# Patient Record
Sex: Female | Born: 1975 | Race: Black or African American | Hispanic: No | Smoking: Never smoker
Health system: Southern US, Community
[De-identification: ages and names within clinical notes are randomized; demographics above are authoritative.]

## PROBLEM LIST (undated history)

## (undated) DIAGNOSIS — E785 Hyperlipidemia, unspecified: Secondary | ICD-10-CM

## (undated) DIAGNOSIS — E7800 Pure hypercholesterolemia, unspecified: Secondary | ICD-10-CM

## (undated) DIAGNOSIS — D709 Neutropenia, unspecified: Secondary | ICD-10-CM

## (undated) DIAGNOSIS — I6529 Occlusion and stenosis of unspecified carotid artery: Secondary | ICD-10-CM

## (undated) HISTORY — DX: Neutropenia, unspecified: D70.9

## (undated) HISTORY — DX: Hyperlipidemia, unspecified: E78.5

## (undated) HISTORY — DX: Pure hypercholesterolemia, unspecified: E78.00

## (undated) HISTORY — DX: Occlusion and stenosis of unspecified carotid artery: I65.29

---

## 2001-10-25 ENCOUNTER — Other Ambulatory Visit: Admission: RE | Admit: 2001-10-25 | Discharge: 2001-10-25 | Payer: Self-pay | Admitting: Gynecology

## 2002-09-20 ENCOUNTER — Other Ambulatory Visit: Admission: RE | Admit: 2002-09-20 | Discharge: 2002-09-20 | Payer: Self-pay | Admitting: Gynecology

## 2003-10-22 ENCOUNTER — Other Ambulatory Visit: Admission: RE | Admit: 2003-10-22 | Discharge: 2003-10-22 | Payer: Self-pay | Admitting: Gynecology

## 2004-08-08 HISTORY — PX: GYNECOLOGIC CRYOSURGERY: SHX857

## 2004-10-22 ENCOUNTER — Other Ambulatory Visit: Admission: RE | Admit: 2004-10-22 | Discharge: 2004-10-22 | Payer: Self-pay | Admitting: Gynecology

## 2005-01-07 ENCOUNTER — Ambulatory Visit (HOSPITAL_BASED_OUTPATIENT_CLINIC_OR_DEPARTMENT_OTHER): Admission: RE | Admit: 2005-01-07 | Discharge: 2005-01-07 | Payer: Self-pay | Admitting: Gynecology

## 2005-05-06 ENCOUNTER — Other Ambulatory Visit: Admission: RE | Admit: 2005-05-06 | Discharge: 2005-05-06 | Payer: Self-pay | Admitting: Gynecology

## 2005-10-24 ENCOUNTER — Other Ambulatory Visit: Admission: RE | Admit: 2005-10-24 | Discharge: 2005-10-24 | Payer: Self-pay | Admitting: Gynecology

## 2006-11-01 ENCOUNTER — Other Ambulatory Visit: Admission: RE | Admit: 2006-11-01 | Discharge: 2006-11-01 | Payer: Self-pay | Admitting: Gynecology

## 2008-08-08 HISTORY — PX: KIDNEY DONATION: SHX685

## 2014-02-26 ENCOUNTER — Encounter (HOSPITAL_COMMUNITY): Payer: Self-pay | Admitting: Emergency Medicine

## 2014-02-26 ENCOUNTER — Emergency Department (HOSPITAL_COMMUNITY): Payer: Managed Care, Other (non HMO)

## 2014-02-26 ENCOUNTER — Emergency Department (HOSPITAL_COMMUNITY)
Admission: EM | Admit: 2014-02-26 | Discharge: 2014-02-27 | Disposition: A | Discharge status: Home / Self Care | Payer: Managed Care, Other (non HMO) | Attending: Emergency Medicine | Admitting: Emergency Medicine

## 2014-02-26 DIAGNOSIS — Z3202 Encounter for pregnancy test, result negative: Secondary | ICD-10-CM | POA: Insufficient documentation

## 2014-02-26 DIAGNOSIS — R509 Fever, unspecified: Secondary | ICD-10-CM | POA: Insufficient documentation

## 2014-02-26 DIAGNOSIS — R319 Hematuria, unspecified: Secondary | ICD-10-CM | POA: Insufficient documentation

## 2014-02-26 DIAGNOSIS — Z79899 Other long term (current) drug therapy: Secondary | ICD-10-CM | POA: Insufficient documentation

## 2014-02-26 DIAGNOSIS — R10819 Abdominal tenderness, unspecified site: Secondary | ICD-10-CM | POA: Insufficient documentation

## 2014-02-26 LAB — COMPREHENSIVE METABOLIC PANEL
ALK PHOS: 68 U/L (ref 39–117)
ALT: 16 U/L (ref 0–35)
AST: 20 U/L (ref 0–37)
Albumin: 3.8 g/dL (ref 3.5–5.2)
Anion gap: 14 (ref 5–15)
BILIRUBIN TOTAL: 0.7 mg/dL (ref 0.3–1.2)
BUN: 8 mg/dL (ref 6–23)
CO2: 23 mEq/L (ref 19–32)
Calcium: 9.9 mg/dL (ref 8.4–10.5)
Chloride: 99 mEq/L (ref 96–112)
Creatinine, Ser: 1.13 mg/dL — ABNORMAL HIGH (ref 0.50–1.10)
GFR, EST AFRICAN AMERICAN: 69 mL/min — AB (ref >90)
GFR, EST NON AFRICAN AMERICAN: 60 mL/min — AB (ref >90)
GLUCOSE: 91 mg/dL (ref 70–99)
POTASSIUM: 4.1 meq/L (ref 3.7–5.3)
Sodium: 136 mEq/L — ABNORMAL LOW (ref 137–147)
Total Protein: 8.4 g/dL — ABNORMAL HIGH (ref 6.0–8.3)

## 2014-02-26 LAB — CBC WITH DIFFERENTIAL/PLATELET
Basophils Absolute: 0 10*3/uL (ref 0.0–0.1)
Basophils Relative: 0 % (ref 0–1)
Eosinophils Absolute: 0 10*3/uL (ref 0.0–0.7)
Eosinophils Relative: 1 % (ref 0–5)
HCT: 42.4 % (ref 36.0–46.0)
HEMOGLOBIN: 13.8 g/dL (ref 12.0–15.0)
LYMPHS ABS: 0.7 10*3/uL (ref 0.7–4.0)
Lymphocytes Relative: 10 % — ABNORMAL LOW (ref 12–46)
MCH: 30.1 pg (ref 26.0–34.0)
MCHC: 32.5 g/dL (ref 30.0–36.0)
MCV: 92.4 fL (ref 78.0–100.0)
MONOS PCT: 11 % (ref 3–12)
Monocytes Absolute: 0.8 10*3/uL (ref 0.1–1.0)
NEUTROS PCT: 78 % — AB (ref 43–77)
Neutro Abs: 5.7 10*3/uL (ref 1.7–7.7)
Platelets: 165 10*3/uL (ref 150–400)
RBC: 4.59 MIL/uL (ref 3.87–5.11)
RDW: 12.1 % (ref 11.5–15.5)
WBC: 7.2 10*3/uL (ref 4.0–10.5)

## 2014-02-26 LAB — WET PREP, GENITAL
Clue Cells Wet Prep HPF POC: NONE SEEN
Trich, Wet Prep: NONE SEEN
WBC, Wet Prep HPF POC: NONE SEEN
Yeast Wet Prep HPF POC: NONE SEEN

## 2014-02-26 LAB — LIPASE, BLOOD: Lipase: 27 U/L (ref 11–59)

## 2014-02-26 LAB — I-STAT CG4 LACTIC ACID, ED: Lactic Acid, Venous: 0.72 mmol/L (ref 0.5–2.2)

## 2014-02-26 LAB — URINALYSIS, ROUTINE W REFLEX MICROSCOPIC
BILIRUBIN URINE: NEGATIVE
Glucose, UA: NEGATIVE mg/dL
KETONES UR: 15 mg/dL — AB
Leukocytes, UA: NEGATIVE
Nitrite: NEGATIVE
Protein, ur: 30 mg/dL — AB
Specific Gravity, Urine: 1.014 (ref 1.005–1.030)
UROBILINOGEN UA: 1 mg/dL (ref 0.0–1.0)
pH: 7.5 (ref 5.0–8.0)

## 2014-02-26 LAB — POC URINE PREG, ED: Preg Test, Ur: NEGATIVE

## 2014-02-26 LAB — URINE MICROSCOPIC-ADD ON

## 2014-02-26 MED ORDER — ACETAMINOPHEN 325 MG PO TABS
975.0000 mg | ORAL_TABLET | Freq: Once | ORAL | Status: AC
  Administered 2014-02-26: 975 mg via ORAL
  Filled 2014-02-26: qty 3

## 2014-02-26 MED ORDER — IOHEXOL 300 MG/ML  SOLN
100.0000 mL | Freq: Once | INTRAMUSCULAR | Status: AC | PRN
  Administered 2014-02-26: 80 mL via INTRAVENOUS

## 2014-02-26 MED ORDER — IOHEXOL 300 MG/ML  SOLN
25.0000 mL | Freq: Once | INTRAMUSCULAR | Status: AC | PRN
  Administered 2014-02-26: 25 mL via ORAL

## 2014-02-26 NOTE — ED Provider Notes (Signed)
CSN: [id number]     Arrival date & time 02/26/14  1843 History   First MD Initiated Contact with Patient 02/26/14 2016     Chief Complaint  Patient presents with  . Abdominal Pain    The patient said she started having abdominal pain Monday and so she stayed in bed. She denies any other symptoms.  The patient said now she is having chills and her pain has gotten worse.  . Fever     (Consider location/radiation/quality/duration/timing/severity/associated sxs/prior Treatment) HPI Complains of abdominal pain onset 2 days ago. The pain is at epigastrium and lower abdomen. Pain at epigastrium is constant. Pain in lower abdomen only occurs when she touches the area. She denies urinary frequency no vaginal discharge. Last bowel movement was 4 days ago, normal. No nausea or vomiting. No other associated symptoms. Nothing makes pain better or worse. Associated symptoms include chills. No known fever. No urinary symptoms. She was advised by nursing care center since she has blood in her urine History reviewed. No pertinent past medical history. Past Surgical History  Procedure Laterality Date  . Kidney donation  2010   History reviewed. No pertinent family history. History  Substance Use Topics  . Smoking status: Never Smoker   . Smokeless tobacco: Never Used  . Alcohol Use: Yes     Comment: Occ   OB History   Grav Para Term Preterm Abortions TAB SAB Ect Mult Living                 Review of Systems  Constitutional: Positive for chills.  HENT: Negative.   Respiratory: Negative.   Cardiovascular: Negative.   Gastrointestinal: Positive for abdominal pain.  Genitourinary: Positive for hematuria.       Last normal menstrual period 02/08/2014  Musculoskeletal: Negative.   Skin: Negative.   Neurological: Negative.   Psychiatric/Behavioral: Negative.       Allergies  Review of patient's allergies indicates no known allergies.  Home Medications   Prior to Admission medications    Medication Sig Start Date End Date Taking? Authorizing Provider  Multiple Vitamin (MULTIVITAMIN WITH MINERALS) TABS tablet Take 1 tablet by mouth daily.   Yes Historical Provider, MD   BP 139/65  Pulse 119  Temp(Src) 101.5 F (38.6 C) (Oral)  Resp 20  SpO2 94%  LMP 02/12/2014 Physical Exam  Nursing note and vitals reviewed. Constitutional: She appears well-developed and well-nourished.  HENT:  Head: Normocephalic and atraumatic.  Eyes: Conjunctivae are normal. Pupils are equal, round, and reactive to light.  Neck: Neck supple. No tracheal deviation present. No thyromegaly present.  Cardiovascular:  No murmur heard. Moderately tachycardic  Pulmonary/Chest: Effort normal and breath sounds normal.  Abdominal: Soft. Bowel sounds are normal. She exhibits mass. She exhibits no distension. There is tenderness. There is no rebound and no guarding.  Diffuse tenderness  Genitourinary:  No external lesions.-whitevaginal discharge. Cervical os closed no cervical motion tenderness no adnexal masses or tenderness  Musculoskeletal: Normal range of motion. She exhibits no edema and no tenderness.  Neurological: She is alert. Coordination normal.  Skin: Skin is warm and dry. No rash noted.  Psychiatric: She has a normal mood and affect.   Declines pain medicine presently    11: 45 p.m. patient resting comfortably ED Course  Procedures (including critical care time) Labs Review Labs Reviewed  CBC WITH DIFFERENTIAL - Abnormal; Notable for the following:    Neutrophils Relative % 78 (*)    Lymphocytes Relative 10 (*)  All other components within normal limits  URINALYSIS, ROUTINE W REFLEX MICROSCOPIC - Abnormal; Notable for the following:    Hgb urine dipstick MODERATE (*)    Ketones, ur 15 (*)    Protein, ur 30 (*)    All other components within normal limits  URINE MICROSCOPIC-ADD ON  COMPREHENSIVE METABOLIC PANEL  LIPASE, BLOOD  POC URINE PREG, ED  I-STAT CG4 LACTIC ACID, ED     Imaging Review No results found.   EKG Interpretation None     Results for orders placed during the hospital encounter of 02/26/14  WET PREP, GENITAL      Result Value Ref Range   Yeast Wet Prep HPF POC NONE SEEN  NONE SEEN   Trich, Wet Prep NONE SEEN  NONE SEEN   Clue Cells Wet Prep HPF POC NONE SEEN  NONE SEEN   WBC, Wet Prep HPF POC NONE SEEN  NONE SEEN  CBC WITH DIFFERENTIAL      Result Value Ref Range   WBC 7.2  4.0 - 10.5 K/uL   RBC 4.59  3.87 - 5.11 MIL/uL   Hemoglobin 13.8  12.0 - 15.0 g/dL   HCT 40.9  81.1 - 91.4 %   MCV 92.4  78.0 - 100.0 fL   MCH 30.1  26.0 - 34.0 pg   MCHC 32.5  30.0 - 36.0 g/dL   RDW 78.2  95.6 - 21.3 %   Platelets 165  150 - 400 K/uL   Neutrophils Relative % 78 (*) 43 - 77 %   Neutro Abs 5.7  1.7 - 7.7 K/uL   Lymphocytes Relative 10 (*) 12 - 46 %   Lymphs Abs 0.7  0.7 - 4.0 K/uL   Monocytes Relative 11  3 - 12 %   Monocytes Absolute 0.8  0.1 - 1.0 K/uL   Eosinophils Relative 1  0 - 5 %   Eosinophils Absolute 0.0  0.0 - 0.7 K/uL   Basophils Relative 0  0 - 1 %   Basophils Absolute 0.0  0.0 - 0.1 K/uL  COMPREHENSIVE METABOLIC PANEL      Result Value Ref Range   Sodium 136 (*) 137 - 147 mEq/L   Potassium 4.1  3.7 - 5.3 mEq/L   Chloride 99  96 - 112 mEq/L   CO2 23  19 - 32 mEq/L   Glucose, Bld 91  70 - 99 mg/dL   BUN 8  6 - 23 mg/dL   Creatinine, Ser 0.86 (*) 0.50 - 1.10 mg/dL   Calcium 9.9  8.4 - 57.8 mg/dL   Total Protein 8.4 (*) 6.0 - 8.3 g/dL   Albumin 3.8  3.5 - 5.2 g/dL   AST 20  0 - 37 U/L   ALT 16  0 - 35 U/L   Alkaline Phosphatase 68  39 - 117 U/L   Total Bilirubin 0.7  0.3 - 1.2 mg/dL   GFR calc non Af Amer 60 (*) >90 mL/min   GFR calc Af Amer 69 (*) >90 mL/min   Anion gap 14  5 - 15  LIPASE, BLOOD      Result Value Ref Range   Lipase 27  11 - 59 U/L  URINALYSIS, ROUTINE W REFLEX MICROSCOPIC      Result Value Ref Range   Color, Urine YELLOW  YELLOW   APPearance CLEAR  CLEAR   Specific Gravity, Urine 1.014  1.005 -  1.030   pH 7.5  5.0 - 8.0   Glucose, UA  NEGATIVE  NEGATIVE mg/dL   Hgb urine dipstick MODERATE (*) NEGATIVE   Bilirubin Urine NEGATIVE  NEGATIVE   Ketones, ur 15 (*) NEGATIVE mg/dL   Protein, ur 30 (*) NEGATIVE mg/dL   Urobilinogen, UA 1.0  0.0 - 1.0 mg/dL   Nitrite NEGATIVE  NEGATIVE   Leukocytes, UA NEGATIVE  NEGATIVE  URINE MICROSCOPIC-ADD ON      Result Value Ref Range   Squamous Epithelial / LPF RARE  RARE   WBC, UA 0-2  <3 WBC/hpf   RBC / HPF 7-10  <3 RBC/hpf   Bacteria, UA RARE  RARE  POC URINE PREG, ED      Result Value Ref Range   Preg Test, Ur NEGATIVE  NEGATIVE  I-STAT CG4 LACTIC ACID, ED      Result Value Ref Range   Lactic Acid, Venous 0.72  0.5 - 2.2 mmol/L   Ct Abdomen Pelvis W Contrast  02/26/2014   CLINICAL DATA:  Lower abdominal pain for 3 days.  Chills.  EXAM: CT ABDOMEN AND PELVIS WITH CONTRAST  TECHNIQUE: Multidetector CT imaging of the abdomen and pelvis was performed using the standard protocol following bolus administration of intravenous contrast.  CONTRAST:  80 mL OMNIPAQUE IOHEXOL 300 MG/ML  SOLN  COMPARISON:  None.  FINDINGS: The lung bases demonstrate minimal atelectasis but are otherwise clear. No pleural or pericardial effusion.  The liver, gallbladder, spleen, adrenal glands, pancreas and biliary tree are unremarkable. The patient is status post left nephrectomy. Compensatory hypertrophy of the right kidney is noted. The stomach, small and large bowel and appendix appear normal. The left ovary appears somewhat prominent measuring 5.5 x 2.8 x 3.7 cm. There is an incomplete hyper attenuating rim like structure likely represent an involuting cyst in the left ovary. There is a small volume of free pelvic fluid. Uterus and right adnexa are unremarkable. No lymphadenopathy is seen. No focal bony abnormality is identified.  IMPRESSION: Findings most compatible with an involuting left ovarian cyst.  Status post left nephrectomy.   Electronically Signed   By: Drusilla Kanner M.D.   On: 02/26/2014 21:37    MDM  Plan trylenol for pain or fever, urine recheck by pmd Final diagnoses:  None  Dx #1 febrile ilness #2 abdominal pain #3 microscopic hematuria       Doug Sou, MD 02/26/14 2352

## 2014-02-26 NOTE — ED Notes (Signed)
The patient said she started having abdominal pain Monday and so she stayed in bed. She denies any other symptoms.  The patient said now she is having chills and her pain has gotten worse.  She went to Urgent Care and they sent her here for a CT scan to rule out pancreatitis and appendicitis.  She was advised she does have blood in her urine.  She did advised me that she is a kidney donor.  She donated a kidney in 2010 to her husband.

## 2014-02-26 NOTE — Discharge Instructions (Signed)
Take Tylenol as directed every 4 hours while awake for pain or for temperature higher than 100.4. You have trace microscopic amounts of blood in your urine. Get your urine rechecked by your primary care physician in a week. If you continue to have blood in your urine, you should ask your primary care physician for a referral to a urologist. Return if your condition worsens for any reason .tonight's CT scan showed a cyst on your left ovary , which is not likely the cause of your pain

## 2014-02-26 NOTE — ED Notes (Signed)
C/o upper abdominal pain x 3-4 days.  Noticed pain in RLQ today when she coughed, pain with palpation.  Went to urgent care, sent here for further evaluation.

## 2014-02-27 LAB — HIV ANTIBODY (ROUTINE TESTING W REFLEX): HIV 1&2 Ab, 4th Generation: NONREACTIVE

## 2014-02-27 LAB — RPR

## 2014-02-28 LAB — GC/CHLAMYDIA PROBE AMP
CT Probe RNA: NEGATIVE
GC PROBE AMP APTIMA: NEGATIVE

## 2014-03-04 ENCOUNTER — Ambulatory Visit
Admission: RE | Admit: 2014-03-04 | Discharge: 2014-03-04 | Disposition: A | Discharge status: Home / Self Care | Payer: Managed Care, Other (non HMO) | Source: Ambulatory Visit | Attending: Nurse Practitioner | Admitting: Nurse Practitioner

## 2014-03-04 ENCOUNTER — Other Ambulatory Visit: Payer: Self-pay | Admitting: Nurse Practitioner

## 2014-03-04 DIAGNOSIS — R05 Cough: Secondary | ICD-10-CM

## 2014-03-04 DIAGNOSIS — R059 Cough, unspecified: Secondary | ICD-10-CM

## 2014-03-04 DIAGNOSIS — R319 Hematuria, unspecified: Secondary | ICD-10-CM

## 2016-03-16 ENCOUNTER — Other Ambulatory Visit: Payer: Self-pay | Admitting: Obstetrics and Gynecology

## 2016-03-16 DIAGNOSIS — N6019 Diffuse cystic mastopathy of unspecified breast: Secondary | ICD-10-CM

## 2016-03-16 DIAGNOSIS — R928 Other abnormal and inconclusive findings on diagnostic imaging of breast: Secondary | ICD-10-CM

## 2016-03-23 ENCOUNTER — Ambulatory Visit
Admission: RE | Admit: 2016-03-23 | Discharge: 2016-03-23 | Disposition: A | Discharge status: Home / Self Care | Payer: Managed Care, Other (non HMO) | Source: Ambulatory Visit | Attending: Obstetrics and Gynecology | Admitting: Obstetrics and Gynecology

## 2016-03-23 DIAGNOSIS — N6019 Diffuse cystic mastopathy of unspecified breast: Secondary | ICD-10-CM

## 2016-03-23 DIAGNOSIS — R928 Other abnormal and inconclusive findings on diagnostic imaging of breast: Secondary | ICD-10-CM

## 2017-09-22 DIAGNOSIS — Z124 Encounter for screening for malignant neoplasm of cervix: Secondary | ICD-10-CM | POA: Diagnosis not present

## 2017-09-22 DIAGNOSIS — Z01419 Encounter for gynecological examination (general) (routine) without abnormal findings: Secondary | ICD-10-CM | POA: Diagnosis not present

## 2017-10-04 DIAGNOSIS — Z1231 Encounter for screening mammogram for malignant neoplasm of breast: Secondary | ICD-10-CM | POA: Diagnosis not present

## 2017-10-10 DIAGNOSIS — Z3202 Encounter for pregnancy test, result negative: Secondary | ICD-10-CM | POA: Diagnosis not present

## 2017-10-10 DIAGNOSIS — N898 Other specified noninflammatory disorders of vagina: Secondary | ICD-10-CM | POA: Diagnosis not present

## 2017-11-23 DIAGNOSIS — R300 Dysuria: Secondary | ICD-10-CM | POA: Diagnosis not present

## 2018-02-16 DIAGNOSIS — E785 Hyperlipidemia, unspecified: Secondary | ICD-10-CM | POA: Diagnosis not present

## 2018-02-16 DIAGNOSIS — Z0000 Encounter for general adult medical examination without abnormal findings: Secondary | ICD-10-CM | POA: Diagnosis not present

## 2018-02-16 DIAGNOSIS — Z23 Encounter for immunization: Secondary | ICD-10-CM | POA: Diagnosis not present

## 2018-06-01 DIAGNOSIS — E785 Hyperlipidemia, unspecified: Secondary | ICD-10-CM | POA: Diagnosis not present

## 2018-06-01 DIAGNOSIS — M545 Low back pain: Secondary | ICD-10-CM | POA: Diagnosis not present

## 2018-06-01 DIAGNOSIS — M62838 Other muscle spasm: Secondary | ICD-10-CM | POA: Diagnosis not present

## 2018-07-20 ENCOUNTER — Ambulatory Visit
Admission: RE | Admit: 2018-07-20 | Discharge: 2018-07-20 | Disposition: A | Discharge status: Home / Self Care | Payer: 59 | Source: Ambulatory Visit | Attending: Internal Medicine | Admitting: Internal Medicine

## 2018-07-20 ENCOUNTER — Other Ambulatory Visit: Payer: Self-pay | Admitting: Internal Medicine

## 2018-07-20 DIAGNOSIS — S46812A Strain of other muscles, fascia and tendons at shoulder and upper arm level, left arm, initial encounter: Secondary | ICD-10-CM

## 2018-07-20 DIAGNOSIS — M542 Cervicalgia: Secondary | ICD-10-CM | POA: Diagnosis not present

## 2018-07-20 DIAGNOSIS — M25512 Pain in left shoulder: Secondary | ICD-10-CM | POA: Diagnosis not present

## 2018-10-24 DIAGNOSIS — M545 Low back pain: Secondary | ICD-10-CM | POA: Diagnosis not present

## 2018-10-24 DIAGNOSIS — M542 Cervicalgia: Secondary | ICD-10-CM | POA: Diagnosis not present

## 2018-10-24 DIAGNOSIS — M25512 Pain in left shoulder: Secondary | ICD-10-CM | POA: Diagnosis not present

## 2018-10-29 DIAGNOSIS — M545 Low back pain: Secondary | ICD-10-CM | POA: Diagnosis not present

## 2018-10-29 DIAGNOSIS — M542 Cervicalgia: Secondary | ICD-10-CM | POA: Diagnosis not present

## 2018-10-29 DIAGNOSIS — M25512 Pain in left shoulder: Secondary | ICD-10-CM | POA: Diagnosis not present

## 2018-11-05 DIAGNOSIS — M545 Low back pain: Secondary | ICD-10-CM | POA: Diagnosis not present

## 2018-11-05 DIAGNOSIS — M25512 Pain in left shoulder: Secondary | ICD-10-CM | POA: Diagnosis not present

## 2018-11-05 DIAGNOSIS — M542 Cervicalgia: Secondary | ICD-10-CM | POA: Diagnosis not present

## 2018-11-08 DIAGNOSIS — M542 Cervicalgia: Secondary | ICD-10-CM | POA: Diagnosis not present

## 2018-11-08 DIAGNOSIS — M545 Low back pain: Secondary | ICD-10-CM | POA: Diagnosis not present

## 2018-11-08 DIAGNOSIS — M25512 Pain in left shoulder: Secondary | ICD-10-CM | POA: Diagnosis not present

## 2018-11-19 DIAGNOSIS — M542 Cervicalgia: Secondary | ICD-10-CM | POA: Diagnosis not present

## 2018-11-19 DIAGNOSIS — M545 Low back pain: Secondary | ICD-10-CM | POA: Diagnosis not present

## 2018-11-19 DIAGNOSIS — M25512 Pain in left shoulder: Secondary | ICD-10-CM | POA: Diagnosis not present

## 2018-11-21 DIAGNOSIS — M542 Cervicalgia: Secondary | ICD-10-CM | POA: Diagnosis not present

## 2018-11-21 DIAGNOSIS — M545 Low back pain: Secondary | ICD-10-CM | POA: Diagnosis not present

## 2018-11-21 DIAGNOSIS — M25512 Pain in left shoulder: Secondary | ICD-10-CM | POA: Diagnosis not present

## 2019-01-03 DIAGNOSIS — Z01419 Encounter for gynecological examination (general) (routine) without abnormal findings: Secondary | ICD-10-CM | POA: Diagnosis not present

## 2019-01-03 DIAGNOSIS — Z1231 Encounter for screening mammogram for malignant neoplasm of breast: Secondary | ICD-10-CM | POA: Diagnosis not present

## 2019-01-04 ENCOUNTER — Other Ambulatory Visit: Payer: Self-pay | Admitting: Obstetrics and Gynecology

## 2019-01-04 DIAGNOSIS — R928 Other abnormal and inconclusive findings on diagnostic imaging of breast: Secondary | ICD-10-CM

## 2019-01-09 ENCOUNTER — Ambulatory Visit
Admission: RE | Admit: 2019-01-09 | Discharge: 2019-01-09 | Disposition: A | Discharge status: Home / Self Care | Payer: 59 | Source: Ambulatory Visit | Attending: Obstetrics and Gynecology | Admitting: Obstetrics and Gynecology

## 2019-01-09 ENCOUNTER — Other Ambulatory Visit: Payer: Self-pay

## 2019-01-09 ENCOUNTER — Other Ambulatory Visit: Payer: Self-pay | Admitting: Obstetrics and Gynecology

## 2019-01-09 DIAGNOSIS — R928 Other abnormal and inconclusive findings on diagnostic imaging of breast: Secondary | ICD-10-CM

## 2019-01-09 DIAGNOSIS — N6489 Other specified disorders of breast: Secondary | ICD-10-CM

## 2019-07-17 ENCOUNTER — Ambulatory Visit
Admission: RE | Admit: 2019-07-17 | Discharge: 2019-07-17 | Disposition: A | Discharge status: Home / Self Care | Payer: 59 | Source: Ambulatory Visit | Attending: Obstetrics and Gynecology | Admitting: Obstetrics and Gynecology

## 2019-07-17 ENCOUNTER — Ambulatory Visit: Payer: 59

## 2019-07-17 ENCOUNTER — Other Ambulatory Visit: Payer: Self-pay

## 2019-07-17 DIAGNOSIS — N6489 Other specified disorders of breast: Secondary | ICD-10-CM

## 2020-02-05 ENCOUNTER — Other Ambulatory Visit: Payer: Self-pay | Admitting: Obstetrics and Gynecology

## 2020-02-05 DIAGNOSIS — R928 Other abnormal and inconclusive findings on diagnostic imaging of breast: Secondary | ICD-10-CM

## 2020-02-19 ENCOUNTER — Ambulatory Visit
Admission: RE | Admit: 2020-02-19 | Discharge: 2020-02-19 | Disposition: A | Discharge status: Home / Self Care | Payer: 59 | Source: Ambulatory Visit | Attending: Obstetrics and Gynecology | Admitting: Obstetrics and Gynecology

## 2020-02-19 ENCOUNTER — Other Ambulatory Visit: Payer: Self-pay

## 2020-02-19 DIAGNOSIS — R928 Other abnormal and inconclusive findings on diagnostic imaging of breast: Secondary | ICD-10-CM

## 2020-03-27 ENCOUNTER — Other Ambulatory Visit: Payer: Self-pay

## 2020-03-27 ENCOUNTER — Other Ambulatory Visit: Payer: 59

## 2020-03-27 DIAGNOSIS — Z20822 Contact with and (suspected) exposure to covid-19: Secondary | ICD-10-CM

## 2020-03-28 LAB — SPECIMEN STATUS REPORT

## 2020-03-28 LAB — NOVEL CORONAVIRUS, NAA: SARS-CoV-2, NAA: NOT DETECTED

## 2020-03-28 LAB — SARS-COV-2, NAA 2 DAY TAT

## 2020-04-03 ENCOUNTER — Other Ambulatory Visit: Payer: Self-pay

## 2020-04-03 ENCOUNTER — Emergency Department (HOSPITAL_COMMUNITY): Payer: 59

## 2020-04-03 ENCOUNTER — Emergency Department (HOSPITAL_COMMUNITY)
Admission: EM | Admit: 2020-04-03 | Discharge: 2020-04-03 | Disposition: A | Discharge status: Home / Self Care | Payer: 59 | Attending: Emergency Medicine | Admitting: Emergency Medicine

## 2020-04-03 DIAGNOSIS — Z5321 Procedure and treatment not carried out due to patient leaving prior to being seen by health care provider: Secondary | ICD-10-CM | POA: Insufficient documentation

## 2020-04-03 DIAGNOSIS — R079 Chest pain, unspecified: Secondary | ICD-10-CM | POA: Insufficient documentation

## 2020-04-03 LAB — BASIC METABOLIC PANEL
Anion gap: 8 (ref 5–15)
BUN: 13 mg/dL (ref 6–20)
CO2: 28 mmol/L (ref 22–32)
Calcium: 9.3 mg/dL (ref 8.9–10.3)
Chloride: 104 mmol/L (ref 98–111)
Creatinine, Ser: 1.07 mg/dL — ABNORMAL HIGH (ref 0.44–1.00)
GFR calc Af Amer: >60 mL/min (ref >60)
GFR calc non Af Amer: >60 mL/min (ref >60)
Glucose, Bld: 99 mg/dL (ref 70–99)
Potassium: 3.8 mmol/L (ref 3.5–5.1)
Sodium: 140 mmol/L (ref 135–145)

## 2020-04-03 LAB — CBC
HCT: 41.3 % (ref 36.0–46.0)
Hemoglobin: 13.5 g/dL (ref 12.0–15.0)
MCH: 30.8 pg (ref 26.0–34.0)
MCHC: 32.7 g/dL (ref 30.0–36.0)
MCV: 94.1 fL (ref 80.0–100.0)
Platelets: 223 10*3/uL (ref 150–400)
RBC: 4.39 MIL/uL (ref 3.87–5.11)
RDW: 11.9 % (ref 11.5–15.5)
WBC: 3.7 10*3/uL — ABNORMAL LOW (ref 4.0–10.5)
nRBC: 0 % (ref 0.0–0.2)

## 2020-04-03 LAB — TROPONIN I (HIGH SENSITIVITY): Troponin I (High Sensitivity): <2 ng/L (ref <18)

## 2020-04-03 LAB — I-STAT BETA HCG BLOOD, ED (NOT ORDERABLE): I-stat hCG, quantitative: <5 m[IU]/mL (ref <5)

## 2020-04-03 NOTE — ED Triage Notes (Signed)
Arrived POV from home. Patient reports  Chest pain that starts in collar bone into chest then around to upper back/neck

## 2021-02-12 ENCOUNTER — Other Ambulatory Visit: Payer: Self-pay | Admitting: Obstetrics and Gynecology

## 2021-02-12 DIAGNOSIS — R928 Other abnormal and inconclusive findings on diagnostic imaging of breast: Secondary | ICD-10-CM

## 2021-03-11 IMAGING — MG MM DIGITAL DIAGNOSTIC UNILAT*L* W/ TOMO W/ CAD
6 series · 6 of 18 positions shown · non-contrast
Comparison: Previous exams.

CLINICAL DATA: Screening recall for possible left breast mass.

EXAM:
DIGITAL DIAGNOSTIC UNILATERAL LEFT MAMMOGRAM WITH TOMO AND CAD;
ULTRASOUND LEFT BREAST LIMITED

[L CC synth-2D]
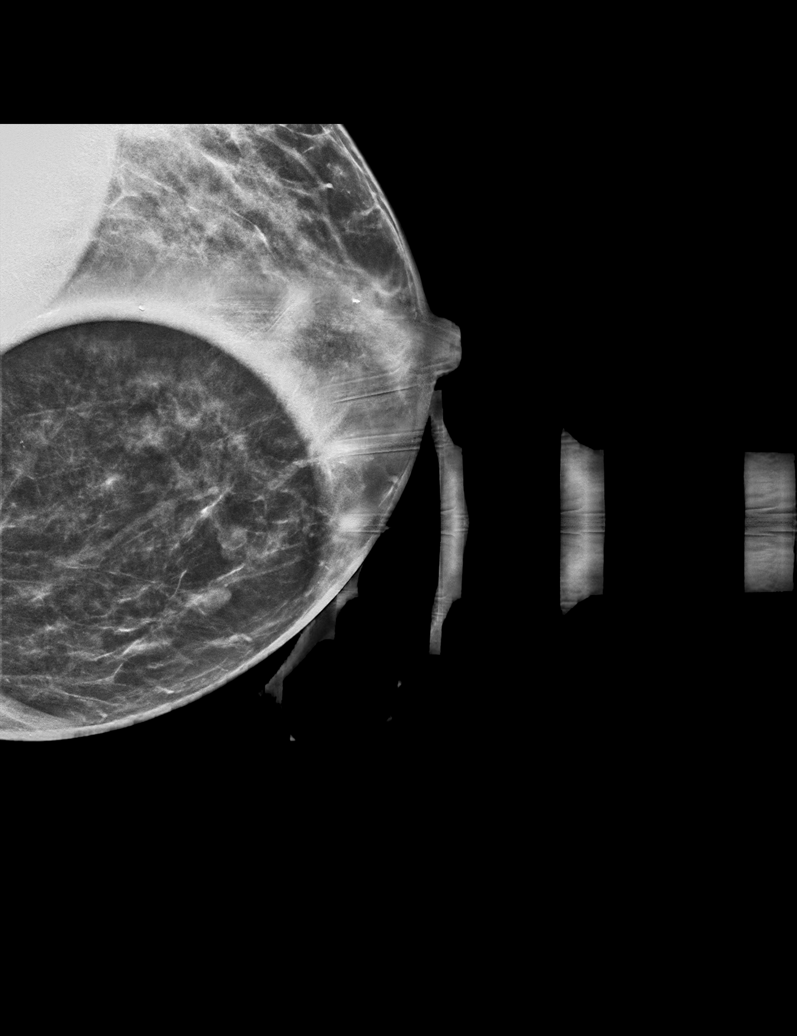

[L MLO synth-2D (1 of 2)]
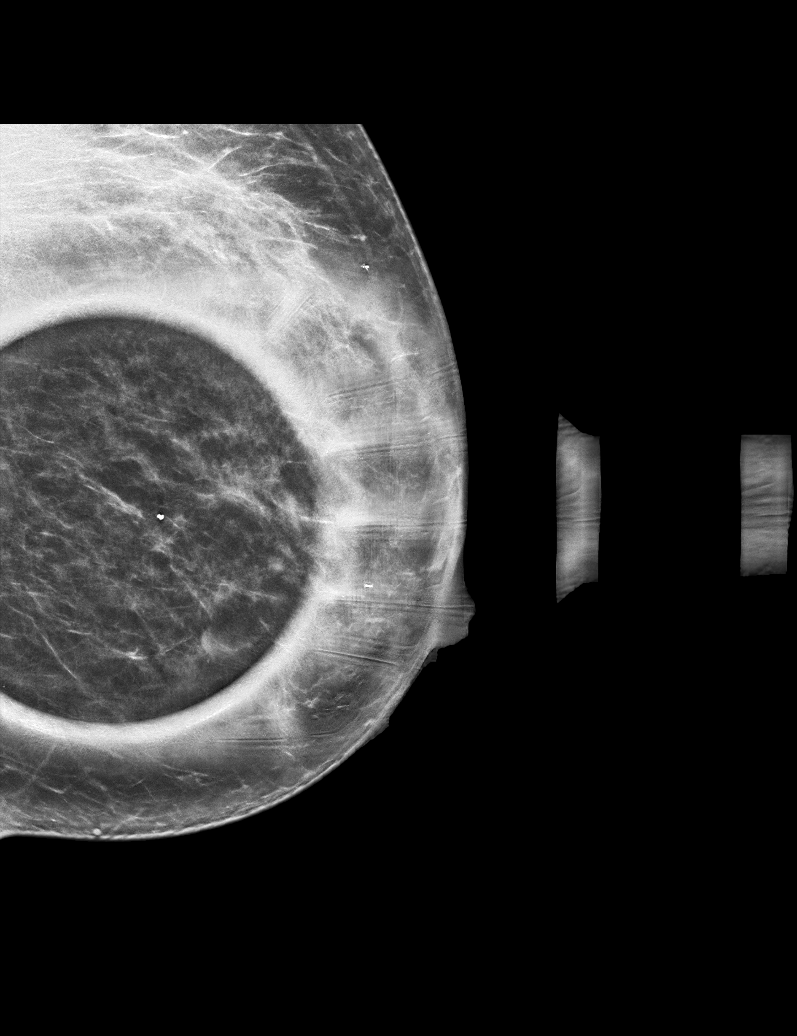

[L MLO synth-2D (2 of 2)]
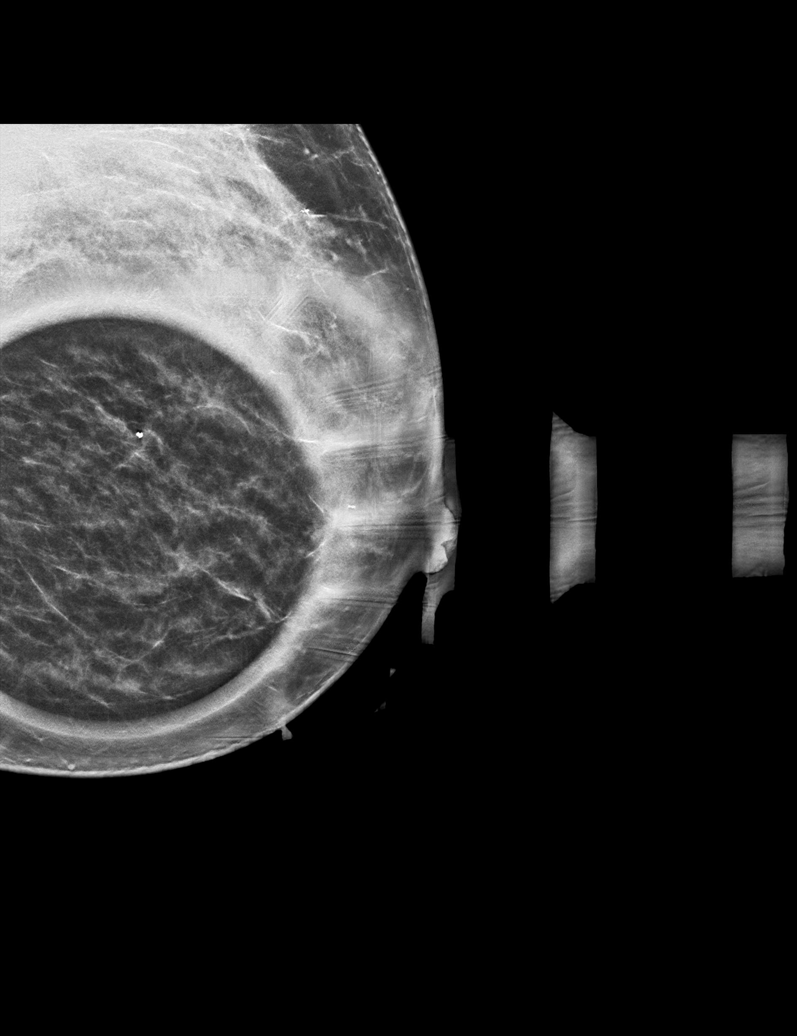

[L MLO tomo (1 of 2) · tomo slice 29/58.0]
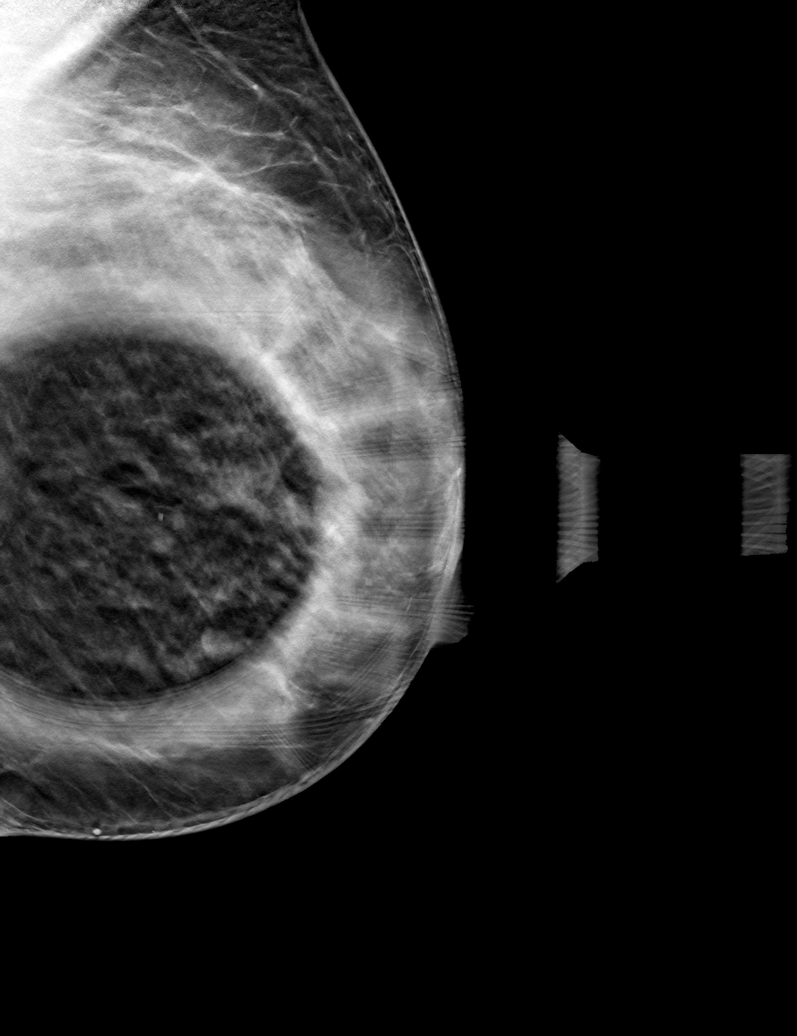

[L CC tomo · tomo slice 29/58.0]
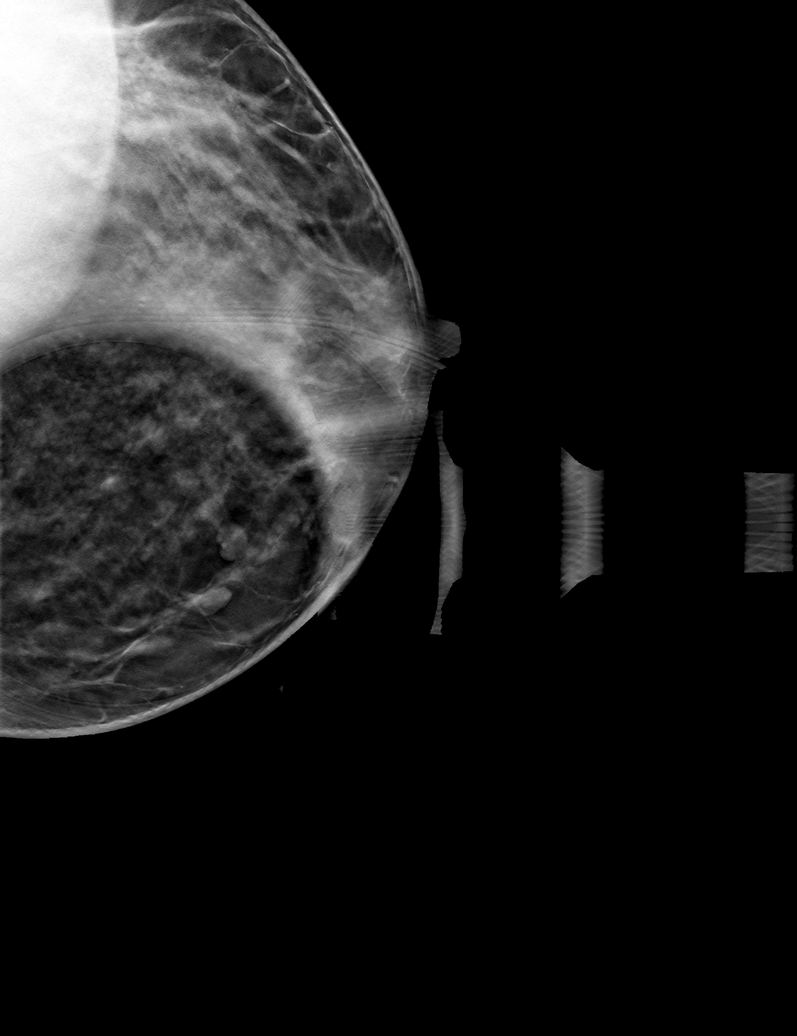

[L MLO tomo (2 of 2) · tomo slice 27/54.0]
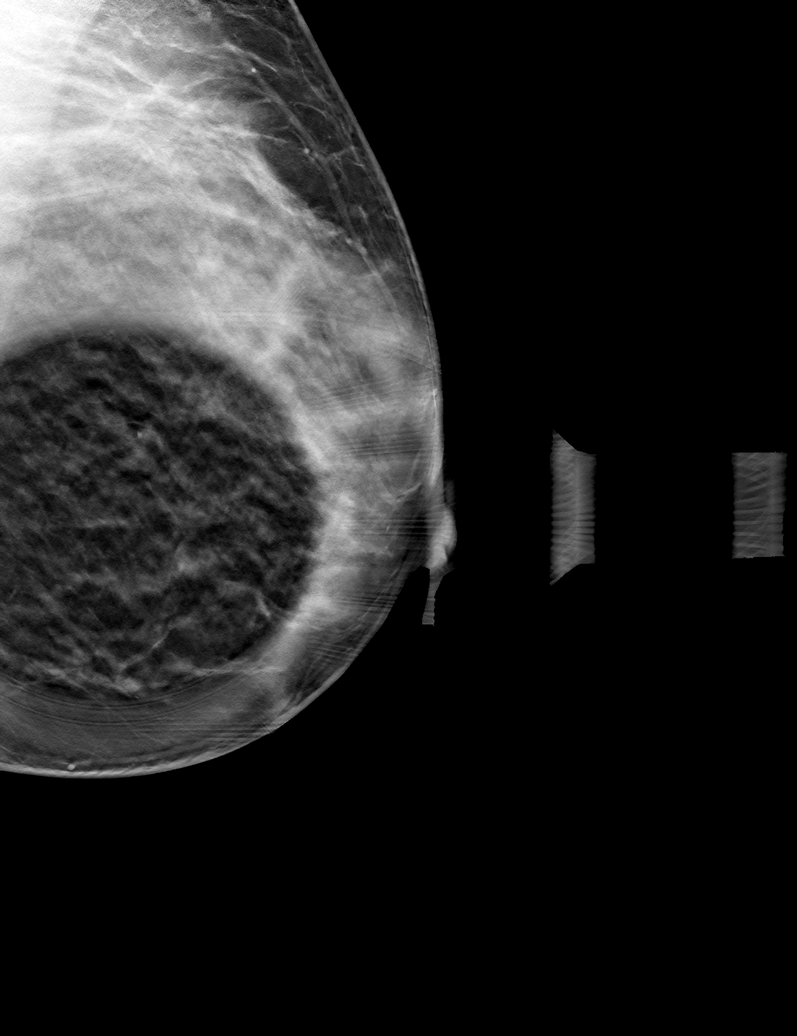

[6 of 18 positions shown; findings below may reference images not displayed]

ACR Breast Density Category c: The breast tissue is heterogeneously
dense, which may obscure small masses.
FINDINGS: Spot compression tomograms were performed of the inner left breast.
There are 2 adjacent oval circumscribed masses measuring 0.7 and
cm.

Mammographic images were processed with CAD.

Targeted ultrasound of the inner left breast was performed. There is
a cyst at 10 o'clock 4 cm from nipple measuring 0.6 x 0.3 x 0.6 cm
and an adjacent cyst measuring 0.5 x 0.4 x 0.6 cm. These cysts
correspond well with the masses seen in the inner left breast at
mammography.
IMPRESSION: Left breast cysts.  No findings of malignancy in the left breast.

RECOMMENDATION:
Screening mammogram in one year.(Code:EN-X-2YA)

I have discussed the findings and recommendations with the patient.
If applicable, a reminder letter will be sent to the patient
regarding the next appointment.

BI-RADS CATEGORY  2: Benign.

## 2022-01-06 ENCOUNTER — Telehealth: Payer: Self-pay | Admitting: Physician Assistant

## 2022-01-06 NOTE — Telephone Encounter (Signed)
Scheduled appt per 5/31 referral. Pt is aware of appt date and time. Pt is aware to arrive 15 mins prior to appt time and to bring and updated insurance card. Pt is aware of appt location.   

## 2022-01-06 NOTE — Progress Notes (Signed)
[facility] [phone]   [phone]  INITIAL CONSULT NOTE  Patient Care Team: Leeroy Cha, MD as PCP - General (Internal Medicine)  Hematological/Oncological History 1) Prior labs: -04/03/2020:WBC 3.7 (L), Hgb 13.5, Plt 223 -12/21/2020: WBC 3.5 (L), Hgb 13.5, Plt 205 ANC 1.7 (L)  -01/08/2021: WBC 3.0 (L), Hgb 13.4, Plt 208, ANC 1.3 (L), Hepatitis panel unremarkable, Vitamin B12 level is 332.  -12/24/2021: WBC 2.8 (L), Hgb 12.9, Plt 190, ANC 1.2 (L)  2) 01/07/2022: Establish care with [facility]  CHIEF COMPLAINTS/PURPOSE OF CONSULTATION:  "Leukopenia/neutropenia "  HISTORY OF PRESENTING ILLNESS:  Meryle Ready 46 y.o. female presents to the hematology clinic for initial evaluation of neutropenia. She was referred to [place] by her PCP after finding neutropenia during an annual visit one year ago. She has a PMH of hypercholesterolemia and being a kidney donor in 2012. She denies recent antibiotic use or immunosuppressant therapy and denies frequent/recent infections. She reports chronic night sweats, soaking through her clothes and sheets, that began when she was a teenager. She also has a chronic low appetite where she forgets to eat. She denies frequent infections, fatigue, fevers, chills, weight loss, diarrhea, arthralgias, cough, SOB, chest pain, and edema. She has no other complaints. Rest of the 10 point ROS is below.   MEDICAL HISTORY:  Past Medical History:  Diagnosis Date   Hypercholesteremia     SURGICAL HISTORY: Past Surgical History:  Procedure Laterality Date   KIDNEY DONATION  2010    SOCIAL HISTORY: Social History   Socioeconomic History   Marital status: Widowed    Spouse name: Not on file   Number of children: Not on file   Years of education: Not on file   Highest education level: Not on file  Occupational History   Not on file  Tobacco Use   Smoking status: Never   Smokeless tobacco: Never  Substance and Sexual  Activity   Alcohol use: Yes    Comment: Occ   Drug use: No   Sexual activity: Yes    Birth control/protection: None  Other Topics Concern   Not on file  Social History Narrative   Not on file   Social Determinants of Health   Financial Resource Strain: Not on file  Food Insecurity: Not on file  Transportation Needs: Not on file  Physical Activity: Not on file  Stress: Not on file  Social Connections: Not on file  Intimate Partner Violence: Not on file    FAMILY HISTORY: History reviewed. No pertinent family history.  ALLERGIES:  has No Known Allergies.  MEDICATIONS:  Current Outpatient Medications  Medication Sig Dispense Refill   atorvastatin (LIPITOR) 80 MG tablet Take 80 mg by mouth daily.     levonorgestrel (KYLEENA) 19.5 MG IUD One     Multiple Vitamin (MULTIVITAMIN WITH MINERALS) TABS tablet Take 1 tablet by mouth daily.     No current facility-administered medications for this visit.    REVIEW OF SYSTEMS:   Constitutional: ( - ) fevers, ( - )  chills , ( + ) night sweats Eyes: ( - ) blurriness of vision, ( - ) double vision, ( - ) watery eyes Ears, nose, mouth, throat, and face: ( - ) mucositis, ( - ) sore throat Respiratory: ( - ) cough, ( - ) dyspnea, ( - ) wheezes Cardiovascular: ( - ) palpitation, ( - ) chest discomfort, ( - ) lower extremity swelling Gastrointestinal:  ( - ) nausea, ( - ) heartburn, ( - )  change in bowel habits Skin: ( - ) abnormal skin rashes Lymphatics: ( - ) new lymphadenopathy, ( - ) easy bruising Neurological: ( - ) numbness, ( - ) tingling, ( - ) new weaknesses Behavioral/Psych: ( - ) mood change, ( - ) new changes  All other systems were reviewed with the patient and are negative.  PHYSICAL EXAMINATION: ECOG PERFORMANCE STATUS: 0 - Asymptomatic  Vitals:   01/07/22 0917  BP: 126/64  Pulse: 67  Resp: 15  Temp: 97.7 F (36.5 C)  SpO2: 100%   Filed Weights   01/07/22 0917  Weight: 149 lb 12.8 oz (67.9 kg)    GENERAL:  well appearing female in NAD  SKIN: skin color, texture, turgor are normal, no rashes or significant lesions EYES: conjunctiva are pink and non-injected, sclera clear OROPHARYNX: no exudate, no erythema; lips, buccal mucosa, and tongue normal  NECK: supple, non-tender LYMPH:  no palpable lymphadenopathy in the cervical or supraclavicular lymph nodes.  LUNGS: clear to auscultation and percussion with normal breathing effort HEART: regular rate & rhythm and no murmurs and no lower extremity edema ABDOMEN: soft, non-tender, non-distended, normal bowel sounds Musculoskeletal: no cyanosis of digits and no clubbing  PSYCH: alert & oriented x 3, fluent speech NEURO: no focal motor/sensory deficits  LABORATORY DATA:  I have reviewed the data as listed    Latest Ref Rng & Units 01/07/2022   10:33 AM 04/03/2020    9:59 PM 02/26/2014    7:12 PM  CBC  WBC 4.0 - 10.5 K/uL 2.9   3.7   7.2    Hemoglobin 12.0 - 15.0 g/dL 13.5   13.5   13.8    Hematocrit 36.0 - 46.0 % 40.6   41.3   42.4    Platelets 150 - 400 K/uL 217   223   165         Latest Ref Rng & Units 01/07/2022   10:33 AM 04/03/2020    9:59 PM 02/26/2014    7:12 PM  CMP  Glucose 70 - 99 mg/dL 79   99   91    BUN 6 - 20 mg/dL 12   13   8     Creatinine 0.44 - 1.00 mg/dL 0.96   1.07   1.13    Sodium 135 - 145 mmol/L 139   140   136    Potassium 3.5 - 5.1 mmol/L 4.0   3.8   4.1    Chloride 98 - 111 mmol/L 106   104   99    CO2 22 - 32 mmol/L 28   28   23     Calcium 8.9 - 10.3 mg/dL 10.1   9.3   9.9    Total Protein 6.5 - 8.1 g/dL 7.7    8.4    Total Bilirubin 0.3 - 1.2 mg/dL 0.6    0.7    Alkaline Phos 38 - 126 U/L 74    68    AST 15 - 41 U/L 17    20    ALT 0 - 44 U/L 14    16     ASSESSMENT & PLAN Tarena Gockley is a 46 y.o. female who presents to the clinic for initial evaluation for neutropenia/leukopenia.   The differentials for neutropenia include benign ethnic neutropenia, infectious etiology, nutritional etiology, inflammatory  etiology, or medication.  The patient does not take any medications known to cause neutropenia. Hepatitis panel from June 2022 was unremarkable. We will check HIV serology  today.  In addition, we will check for nutritional anemias with B12 and folate levels.    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  This is a common condition whereby individuals of African or Mediterranean descent tend to have slightly lower white blood cell counts in the general population.  Hallmark of the syndrome is an Potter between 1000 and 1500 which this patient falls into.  This is of little to no clinical consequence.  This is a diagnosis of exclusion therefore we will do further work-up in order to ensure there is no other etiology.   Assuming none of the studies show any concerning abnormalities, we will see the patient back in 6 months time to continue to monitor.     # Leukopenia/Neutropenia  --Rule out infectious etiology with HIV serology. Hepatitis panel from June 2022 was unremarkable.  --Nutritional evaluation with  vitamin B12, MMA and folate.  --Inflammatory evaluation with ESR and CRP.   --Repeat CBC and CMP.  Additionally, we will order peripheral blood film.  --Assuming there are no concerning findings on the blood work-up today. we will plan to see the patient back in approximately 6 months time.   Orders Placed This Encounter  Procedures   CBC with Differential ([facility] Only)    Standing Status:   Future    Number of Occurrences:   1    Standing Expiration Date:   01/07/2023   CMP ([facility] only)    Standing Status:   Future    Number of Occurrences:   1    Standing Expiration Date:   01/07/2023   Save Smear (SS[facility])    Standing Status:   Future    Number of Occurrences:   1    Standing Expiration Date:   01/07/2023   Sedimentation rate    Standing Status:   Future    Number of Occurrences:   1    Standing Expiration Date:   01/06/2023   C-reactive protein    Standing  Status:   Future    Number of Occurrences:   1    Standing Expiration Date:   01/06/2023   Vitamin B12    Standing Status:   Future    Number of Occurrences:   1    Standing Expiration Date:   01/06/2023   Methylmalonic acid, serum    Standing Status:   Future    Number of Occurrences:   1    Standing Expiration Date:   01/06/2023   Folate, Serum    Standing Status:   Future    Number of Occurrences:   1    Standing Expiration Date:   01/06/2023   HIV antibody (with reflex)    Standing Status:   Future    Number of Occurrences:   1    Standing Expiration Date:   01/06/2023    All questions were answered. The patient knows to call the clinic with any problems, questions or concerns.  I have spent a total of 60 minutes minutes of face-to-face and non-face-to-face time, preparing to see the patient, obtaining and/or reviewing separately obtained history, performing a medically appropriate examination, counseling and educating the patient, ordering tests, documenting clinical information in the electronic health record, and care coordination.   Dede Query, PA-C Department of Hematology/Oncology [place] at [facility] Phone: [phone]  Patient was seen with Dr. Lorenso Courier   I have read the above note and personally examined the patient. I  agree with the assessment and plan as noted above.  Briefly Mrs. Hypolite is a 46 year old female who presents for evaluation of leukopenia/neutropenia.  Prior labs show a mildly reduced white blood cell count with an ANC ranging between 1.0 and 1.5.  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  If there is no clear etiology would recommend follow-up in 6 months time in order to reevaluate and trend the white blood cell count.  The patient voiced understanding of the plan moving  forward.   Ledell Peoples, MD Department of Hematology/Oncology [place] at [facility] Phone: [phone] Pager: [phone] Email: [email or web address]

## 2022-01-07 ENCOUNTER — Inpatient Hospital Stay: Payer: 59 | Attending: Physician Assistant | Admitting: Physician Assistant

## 2022-01-07 ENCOUNTER — Encounter: Payer: Self-pay | Admitting: Physician Assistant

## 2022-01-07 ENCOUNTER — Inpatient Hospital Stay: Payer: 59

## 2022-01-07 ENCOUNTER — Other Ambulatory Visit: Payer: Self-pay

## 2022-01-07 VITALS — BP 126/64 | HR 67 | Temp 97.7°F | Resp 15 | Wt 149.8 lb

## 2022-01-07 DIAGNOSIS — R61 Generalized hyperhidrosis: Secondary | ICD-10-CM | POA: Diagnosis not present

## 2022-01-07 DIAGNOSIS — D709 Neutropenia, unspecified: Secondary | ICD-10-CM | POA: Diagnosis present

## 2022-01-07 DIAGNOSIS — E7800 Pure hypercholesterolemia, unspecified: Secondary | ICD-10-CM | POA: Diagnosis not present

## 2022-01-07 DIAGNOSIS — Z79899 Other long term (current) drug therapy: Secondary | ICD-10-CM | POA: Diagnosis not present

## 2022-01-07 LAB — CMP (CANCER CENTER ONLY)
ALT: 14 U/L (ref 0–44)
AST: 17 U/L (ref 15–41)
Albumin: 4.4 g/dL (ref 3.5–5.0)
Alkaline Phosphatase: 74 U/L (ref 38–126)
Anion gap: 5 (ref 5–15)
BUN: 12 mg/dL (ref 6–20)
CO2: 28 mmol/L (ref 22–32)
Calcium: 10.1 mg/dL (ref 8.9–10.3)
Chloride: 106 mmol/L (ref 98–111)
Creatinine: 0.96 mg/dL (ref 0.44–1.00)
GFR, Estimated: >60 mL/min (ref >60)
Glucose, Bld: 79 mg/dL (ref 70–99)
Potassium: 4 mmol/L (ref 3.5–5.1)
Sodium: 139 mmol/L (ref 135–145)
Total Bilirubin: 0.6 mg/dL (ref 0.3–1.2)
Total Protein: 7.7 g/dL (ref 6.5–8.1)

## 2022-01-07 LAB — CBC WITH DIFFERENTIAL (CANCER CENTER ONLY)
Abs Immature Granulocytes: 0 10*3/uL (ref 0.00–0.07)
Basophils Absolute: 0 10*3/uL (ref 0.0–0.1)
Basophils Relative: 1 %
Eosinophils Absolute: 0.1 10*3/uL (ref 0.0–0.5)
Eosinophils Relative: 2 %
HCT: 40.6 % (ref 36.0–46.0)
Hemoglobin: 13.5 g/dL (ref 12.0–15.0)
Immature Granulocytes: 0 %
Lymphocytes Relative: 40 %
Lymphs Abs: 1.2 10*3/uL (ref 0.7–4.0)
MCH: 30.1 pg (ref 26.0–34.0)
MCHC: 33.3 g/dL (ref 30.0–36.0)
MCV: 90.4 fL (ref 80.0–100.0)
Monocytes Absolute: 0.2 10*3/uL (ref 0.1–1.0)
Monocytes Relative: 8 %
Neutro Abs: 1.5 10*3/uL — ABNORMAL LOW (ref 1.7–7.7)
Neutrophils Relative %: 49 %
Platelet Count: 217 10*3/uL (ref 150–400)
RBC: 4.49 MIL/uL (ref 3.87–5.11)
RDW: 11.7 % (ref 11.5–15.5)
WBC Count: 2.9 10*3/uL — ABNORMAL LOW (ref 4.0–10.5)
nRBC: 0 % (ref 0.0–0.2)

## 2022-01-07 LAB — FOLATE: Folate: 28.9 ng/mL (ref >5.9)

## 2022-01-07 LAB — SAVE SMEAR(SSMR), FOR PROVIDER SLIDE REVIEW

## 2022-01-07 LAB — HIV ANTIBODY (ROUTINE TESTING W REFLEX): HIV Screen 4th Generation wRfx: NONREACTIVE

## 2022-01-07 LAB — VITAMIN B12: Vitamin B-12: 359 pg/mL (ref 180–914)

## 2022-01-07 LAB — C-REACTIVE PROTEIN: CRP: 0.6 mg/dL (ref <1.0)

## 2022-01-07 LAB — SEDIMENTATION RATE: Sed Rate: 17 mm/hr (ref 0–22)

## 2022-01-10 ENCOUNTER — Telehealth: Payer: Self-pay | Admitting: Physician Assistant

## 2022-01-10 LAB — METHYLMALONIC ACID, SERUM: Methylmalonic Acid, Quantitative: 81 nmol/L (ref 0–378)

## 2022-01-10 NOTE — Telephone Encounter (Signed)
I called. Ms. Stacey Davidson to review the lab results from 01/07/2022. Findings show mild neutropenia with ANC 1.5. Remaining results were unremarkable. 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 Reviewed that this should cause little to no clinical significance. No further workup is required at this time. We will see patient back in 6 months to monitor levels. She expressed understanding of the plan provided.

## 2022-02-09 ENCOUNTER — Telehealth: Payer: Self-pay | Admitting: *Deleted

## 2022-02-09 NOTE — Telephone Encounter (Signed)
Stacey Davidson was asking about signs of neutropenia. Discussed looking out for signs of infection. States she has been fatigued. No signs of any infection

## 2022-07-11 ENCOUNTER — Other Ambulatory Visit: Payer: Self-pay | Admitting: Physician Assistant

## 2022-07-11 DIAGNOSIS — D709 Neutropenia, unspecified: Secondary | ICD-10-CM

## 2022-07-12 ENCOUNTER — Ambulatory Visit: Payer: 59 | Admitting: Physician Assistant

## 2022-07-12 ENCOUNTER — Inpatient Hospital Stay (HOSPITAL_BASED_OUTPATIENT_CLINIC_OR_DEPARTMENT_OTHER): Payer: 59 | Admitting: Physician Assistant

## 2022-07-12 ENCOUNTER — Inpatient Hospital Stay: Payer: 59 | Attending: Physician Assistant

## 2022-07-12 ENCOUNTER — Other Ambulatory Visit: Payer: 59

## 2022-07-12 VITALS — BP 120/73 | HR 62 | Temp 97.7°F | Resp 14 | Wt 146.3 lb

## 2022-07-12 DIAGNOSIS — D709 Neutropenia, unspecified: Secondary | ICD-10-CM | POA: Diagnosis present

## 2022-07-12 DIAGNOSIS — E7800 Pure hypercholesterolemia, unspecified: Secondary | ICD-10-CM | POA: Diagnosis not present

## 2022-07-12 DIAGNOSIS — Z79899 Other long term (current) drug therapy: Secondary | ICD-10-CM | POA: Insufficient documentation

## 2022-07-12 LAB — CBC WITH DIFFERENTIAL (CANCER CENTER ONLY)
Abs Immature Granulocytes: 0.01 10*3/uL (ref 0.00–0.07)
Basophils Absolute: 0 10*3/uL (ref 0.0–0.1)
Basophils Relative: 1 %
Eosinophils Absolute: 0.1 10*3/uL (ref 0.0–0.5)
Eosinophils Relative: 1 %
HCT: 38.2 % (ref 36.0–46.0)
Hemoglobin: 12.6 g/dL (ref 12.0–15.0)
Immature Granulocytes: 0 %
Lymphocytes Relative: 36 %
Lymphs Abs: 1.4 10*3/uL (ref 0.7–4.0)
MCH: 31.2 pg (ref 26.0–34.0)
MCHC: 33 g/dL (ref 30.0–36.0)
MCV: 94.6 fL (ref 80.0–100.0)
Monocytes Absolute: 0.4 10*3/uL (ref 0.1–1.0)
Monocytes Relative: 10 %
Neutro Abs: 2.1 10*3/uL (ref 1.7–7.7)
Neutrophils Relative %: 52 %
Platelet Count: 196 10*3/uL (ref 150–400)
RBC: 4.04 MIL/uL (ref 3.87–5.11)
RDW: 12 % (ref 11.5–15.5)
WBC Count: 3.9 10*3/uL — ABNORMAL LOW (ref 4.0–10.5)
nRBC: 0 % (ref 0.0–0.2)

## 2022-07-12 LAB — CMP (CANCER CENTER ONLY)
ALT: 11 U/L (ref 0–44)
AST: 12 U/L — ABNORMAL LOW (ref 15–41)
Albumin: 4 g/dL (ref 3.5–5.0)
Alkaline Phosphatase: 64 U/L (ref 38–126)
Anion gap: 4 — ABNORMAL LOW (ref 5–15)
BUN: 10 mg/dL (ref 6–20)
CO2: 28 mmol/L (ref 22–32)
Calcium: 9.3 mg/dL (ref 8.9–10.3)
Chloride: 106 mmol/L (ref 98–111)
Creatinine: 0.96 mg/dL (ref 0.44–1.00)
GFR, Estimated: >60 mL/min (ref >60)
Glucose, Bld: 97 mg/dL (ref 70–99)
Potassium: 3.8 mmol/L (ref 3.5–5.1)
Sodium: 138 mmol/L (ref 135–145)
Total Bilirubin: 0.6 mg/dL (ref 0.3–1.2)
Total Protein: 7.1 g/dL (ref 6.5–8.1)

## 2022-07-12 NOTE — Progress Notes (Signed)
[facility] [phone]   [phone]  PROGRESS NOTE  Patient Care Team: Lorenda Ishihara, MD as PCP - General (Internal Medicine)  Hematological/Oncological History 1) Prior labs: -04/03/2020:WBC 3.7 (L), Hgb 13.5, Plt 223 -12/21/2020: WBC 3.5 (L), Hgb 13.5, Plt 205 ANC 1.7 (L)  -01/08/2021: WBC 3.0 (L), Hgb 13.4, Plt 208, ANC 1.3 (L), Hepatitis panel unremarkable, Vitamin B12 level is 332.  -12/24/2021: WBC 2.8 (L), Hgb 12.9, Plt 190, ANC 1.2 (L)  2) 01/07/2022: Establish care with [facility]ology  CHIEF COMPLAINTS/PURPOSE OF CONSULTATION:  "Leukopenia/neutropenia "  HISTORY OF PRESENTING ILLNESS:  Stacey Davidson 46 y.o. female returns for a follow up visit for neutropenia. She is unaccompanied for this visit. She was last seen on 01/07/2022 to establish care. In the interim, she reports changing her diet to plant based due to high cholesterol. She reports her energy and appetite are stable. She is eating more consistently throughout the day. She denies any recurrent infections. She denies fatigue, fevers, chills, weight loss, diarrhea, arthralgias, cough, SOB, chest pain, and edema. She has no other complaints. Rest of the 10 point ROS is below.   MEDICAL HISTORY:  Past Medical History:  Diagnosis Date   Hypercholesteremia     SURGICAL HISTORY: Past Surgical History:  Procedure Laterality Date   KIDNEY DONATION  2010    SOCIAL HISTORY: Social History   Socioeconomic History   Marital status: Widowed    Spouse name: Not on file   Number of children: Not on file   Years of education: Not on file   Highest education level: Not on file  Occupational History   Not on file  Tobacco Use   Smoking status: Never   Smokeless tobacco: Never  Substance and Sexual Activity   Alcohol use: Yes    Comment: Occ   Drug use: No   Sexual activity: Yes    Birth control/protection: None  Other Topics Concern   Not on file  Social History Narrative    Not on file   Social Determinants of Health   Financial Resource Strain: Not on file  Food Insecurity: Not on file  Transportation Needs: Not on file  Physical Activity: Not on file  Stress: Not on file  Social Connections: Not on file  Intimate Partner Violence: Not on file    FAMILY HISTORY: No family history on file.  ALLERGIES:  has No Known Allergies.  MEDICATIONS:  Current Outpatient Medications  Medication Sig Dispense Refill   aspirin EC 81 MG tablet Take by mouth.     atorvastatin (LIPITOR) 20 MG tablet Take 20 mg by mouth daily.     levonorgestrel (KYLEENA) 19.5 MG IUD One     Multiple Vitamin (MULTIVITAMIN WITH MINERALS) TABS tablet Take 1 tablet by mouth daily.     No current facility-administered medications for this visit.    REVIEW OF SYSTEMS:   Constitutional: ( - ) fevers, ( - )  chills , ( - ) night sweats Eyes: ( - ) blurriness of vision, ( - ) double vision, ( - ) watery eyes Ears, nose, mouth, throat, and face: ( - ) mucositis, ( - ) sore throat Respiratory: ( - ) cough, ( - ) dyspnea, ( - ) wheezes Cardiovascular: ( - ) palpitation, ( - ) chest discomfort, ( - ) lower extremity swelling Gastrointestinal:  ( - ) nausea, ( - ) heartburn, ( - ) change in bowel habits Skin: ( - ) abnormal skin rashes Lymphatics: ( - )  new lymphadenopathy, ( - ) easy bruising Neurological: ( - ) numbness, ( - ) tingling, ( - ) new weaknesses Behavioral/Psych: ( - ) mood change, ( - ) new changes  All other systems were reviewed with the patient and are negative.  PHYSICAL EXAMINATION: ECOG PERFORMANCE STATUS: 0 - Asymptomatic  Vitals:   07/12/22 1518  BP: 120/73  Pulse: 62  Resp: 14  Temp: 97.7 F (36.5 C)  SpO2: 100%   Filed Weights   07/12/22 1518  Weight: 146 lb 4.8 oz (66.4 kg)    GENERAL: well appearing female in NAD  SKIN: skin color, texture, turgor are normal, no rashes or significant lesions EYES: conjunctiva are pink and non-injected, sclera  clear OROPHARYNX: no exudate, no erythema; lips, buccal mucosa, and tongue normal  LYMPH:  no palpable lymphadenopathy in the cervical or supraclavicular lymph nodes.  LUNGS: clear to auscultation and percussion with normal breathing effort HEART: regular rate & rhythm and no murmurs and no lower extremity edema Musculoskeletal: no cyanosis of digits and no clubbing  PSYCH: alert & oriented x 3, fluent speech NEURO: no focal motor/sensory deficits  LABORATORY DATA:  I have reviewed the data as listed    Latest Ref Rng & Units 07/12/2022    3:04 PM 01/07/2022   10:33 AM 04/03/2020    9:59 PM  CBC  WBC 4.0 - 10.5 K/uL 3.9  2.9  3.7   Hemoglobin 12.0 - 15.0 g/dL 40.9  73.5  32.9   Hematocrit 36.0 - 46.0 % 38.2  40.6  41.3   Platelets 150 - 400 K/uL 196  217  223        Latest Ref Rng & Units 01/07/2022   10:33 AM 04/03/2020    9:59 PM 02/26/2014    7:12 PM  CMP  Glucose 70 - 99 mg/dL 79  99  91   BUN 6 - 20 mg/dL 12  13  8    Creatinine 0.44 - 1.00 mg/dL  9.24  2.68   Sodium 135 - 145 mmol/L 139  140  136   Potassium 3.5 - 5.1 mmol/L 4.0  3.8  4.1   Chloride 98 - 111 mmol/L 106  104  99   CO2 22 - 32 mmol/L 28  28  23    Calcium 8.9 - 10.3 mg/dL 3.41  9.3  9.9   Total Protein 6.5 - 8.1 g/dL 7.7   8.4   Total Bilirubin 0.3 - 1.2 mg/dL 0.6   0.7   Alkaline Phos 38 - 126 U/L 74   68   AST 15 - 41 U/L 17   20   ALT 0 - 44 U/L 14   16    ASSESSMENT & PLAN Stacey Davidson is a 46 y.o. female who presents to the clinic for a follow up for neutropenia/leukopenia    # Leukopenia/Neutropenia  --Workup from 01/07/2022 ruled out HIV, vitmain B12/folate deficiencies. Inflammatory markers were normal. Hepatitis panel from June 2022 was unremarkable.  --Labs today show leukopenia has nearly resolved with WBC 3.9. Neutropenia has resolved without any other cytopenias.  --No further workup is recommended. Okay to monitor with PCP moving forward.  --Return to hematology clinic as needed.   No  orders of the defined types were placed in this encounter.   All questions were answered. The patient knows to call the clinic with any problems, questions or concerns.  I have spent a total of 25 minutes minutes of face-to-face and non-face-to-face time, preparing  to see the patient,  performing a medically appropriate examination, counseling and educating the patient,  documenting clinical information in the electronic health record, and care coordination.   Georga Kaufmann, PA-C Department of Hematology/Oncology [facility] Phone: [phone]

## 2023-02-23 ENCOUNTER — Ambulatory Visit: Payer: 59 | Admitting: Family Medicine

## 2023-02-28 NOTE — Progress Notes (Deleted)
 NEW PT ESTABLISH CARE  Assessment and Plan:  Discussed med's effects and SE's. Screening labs and tests as requested with regular follow-up as recommended. Over 40 minutes of exam, counseling, chart review, and complex, high level critical decision making was performed this visit.   HPI  47 y.o. female  presents for a complete physical and follow up for has Neutropenia (HCC) on their problem list..  Her blood pressure {HAS HAS NOT:18834} been controlled at home, today their BP is   She {DOES_DOES JXB:14782} workout. She denies chest pain, shortness of breath, dizziness.   She {ACTION; IS/IS NFA:21308657} on cholesterol medication and denies myalgias. Her cholesterol {ACTION; IS/IS NOT:21021397} at goal. The cholesterol last visit was:  No results found for: "CHOL", "HDL", "LDLCALC", "LDLDIRECT", "TRIG", "CHOLHDL"  She {Has/has not:18111} been working on diet and exercise for ***prediabetes, she {ACTION; IS/IS NOT:21021397} on bASA, she {ACTION; IS/IS QIO:96295284} on ACE/ARB and denies {Symptoms; diabetes w/o none:19199}. Last A1C in the office was: No results found for: "HGBA1C"  Last GFR: Lab Results  Component Value Date   GFRNONAA >60 07/12/2022   Lab Results  Component Value Date   GFRAA >60 04/03/2020    Patient is on Vitamin D supplement.   No results found for: "VD25OH"    Current Medications:  Current Outpatient Medications on File Prior to Visit  Medication Sig Dispense Refill   aspirin EC 81 MG tablet Take by mouth.     atorvastatin (LIPITOR) 20 MG tablet Take 20 mg by mouth daily.     levonorgestrel (KYLEENA) 19.5 MG IUD One     Multiple Vitamin (MULTIVITAMIN WITH MINERALS) TABS tablet Take 1 tablet by mouth daily.     No current facility-administered medications on file prior to visit.   Allergies:  No Known Allergies Medical History:  She has Neutropenia (HCC) on their problem list. Health Maintenance:    There is no immunization history on file for this  patient.  Tetanus: Pneumovax: Prevnar 13:  Flu vaccine: Zostavax: LMP: Pap: MGM:  DEXA: Colonoscopy: EGD:  Last Dental Exam: Last Eye Exam: Patient Care Team: Lorenda Ishihara, MD as PCP - General (Internal Medicine)  Surgical History:  She has a past surgical history that includes Kidney donation (2010). Family History:  Herfamily history is not on file. Social History:  She reports that she has never smoked. She has never used smokeless tobacco. She reports current alcohol use. She reports that she does not use drugs.  Review of Systems: ROS  Physical Exam: There is no height or weight on file to calculate BMI. There were no vitals taken for this visit. General Appearance: Well nourished, in no apparent distress.  Eyes: PERRLA, EOMs, conjunctiva no swelling or erythema, normal fundi and vessels.  Sinuses: No Frontal/maxillary tenderness  ENT/Mouth: Ext aud canals clear, normal light reflex with TMs without erythema, bulging. Good dentition. No erythema, swelling, or exudate on post pharynx. Tonsils not swollen or erythematous. Hearing normal.  Neck: Supple, thyroid normal. No bruits  Respiratory: Respiratory effort normal, BS equal bilaterally without rales, rhonchi, wheezing or stridor.  Cardio: RRR without murmurs, rubs or gallops. Brisk peripheral pulses without edema.  Chest: symmetric, with normal excursions and percussion.  Breasts: Symmetric, without lumps, nipple discharge, retractions.  Abdomen: Soft, nontender, no guarding, rebound, hernias, masses, or organomegaly.  Lymphatics: Non tender without lymphadenopathy.  Genitourinary:  Musculoskeletal: Full ROM all peripheral extremities,5/5 strength, and normal gait.  Skin: Warm, dry without rashes, lesions, ecchymosis. Neuro: Cranial nerves intact, reflexes equal  bilaterally. Normal muscle tone, no cerebellar symptoms. Sensation intact.  Psych: Awake and oriented X 3, normal affect, Insight and Judgment  appropriate.   EKG: WNL no ST changes. AORTA SCAN: WNL    E  12:26 PM  [facility]

## 2023-03-01 ENCOUNTER — Ambulatory Visit: Payer: 59 | Admitting: Nurse Practitioner

## 2023-03-06 NOTE — Progress Notes (Unsigned)
 NEW PT ESTABLISH CARE  Assessment and Plan:  Discussed med's effects and SE's. Screening labs and tests as requested with regular follow-up as recommended. Over 40 minutes of exam, counseling, chart review, and complex, high level critical decision making was performed this visit.   HPI  47 y.o. female  presents for a complete physical and follow up for has Neutropenia (HCC) on their problem list..  Her blood pressure {HAS HAS NOT:18834} been controlled at home, today their BP is   She {DOES_DOES ZOX:09604} workout. She denies chest pain, shortness of breath, dizziness.   She {ACTION; IS/IS VWU:98119147} on cholesterol medication and denies myalgias. Her cholesterol {ACTION; IS/IS NOT:21021397} at goal. The cholesterol last visit was:  No results found for: "CHOL", "HDL", "LDLCALC", "LDLDIRECT", "TRIG", "CHOLHDL"  She {Has/has not:18111} been working on diet and exercise for ***prediabetes, she {ACTION; IS/IS NOT:21021397} on bASA, she {ACTION; IS/IS WGN:56213086} on ACE/ARB and denies {Symptoms; diabetes w/o none:19199}. Last A1C in the office was: No results found for: "HGBA1C"  Last GFR: Lab Results  Component Value Date   GFRNONAA >60 07/12/2022   Lab Results  Component Value Date   GFRAA >60 04/03/2020    Patient is on Vitamin D supplement.   No results found for: "VD25OH"    Current Medications:  Current Outpatient Medications on File Prior to Visit  Medication Sig Dispense Refill   aspirin EC 81 MG tablet Take by mouth.     atorvastatin (LIPITOR) 20 MG tablet Take 20 mg by mouth daily.     levonorgestrel (KYLEENA) 19.5 MG IUD One     Multiple Vitamin (MULTIVITAMIN WITH MINERALS) TABS tablet Take 1 tablet by mouth daily.     No current facility-administered medications on file prior to visit.   Allergies:  No Known Allergies Medical History:  She has Neutropenia (HCC) on their problem list. Health Maintenance:    There is no immunization history on file for this  patient.  Tetanus: Pneumovax: Prevnar 13:  Flu vaccine: Zostavax: LMP: Pap: MGM:  DEXA: Colonoscopy: EGD:  Last Dental Exam: Last Eye Exam: Patient Care Team: Lorenda Ishihara, MD as PCP - General (Internal Medicine)  Surgical History:  She has a past surgical history that includes Kidney donation (2010). Family History:  Herfamily history is not on file. Social History:  She reports that she has never smoked. She has never used smokeless tobacco. She reports current alcohol use. She reports that she does not use drugs.  Review of Systems: ROS  Physical Exam: There is no height or weight on file to calculate BMI. There were no vitals taken for this visit. General Appearance: Well nourished, in no apparent distress.  Eyes: PERRLA, EOMs, conjunctiva no swelling or erythema, normal fundi and vessels.  Sinuses: No Frontal/maxillary tenderness  ENT/Mouth: Ext aud canals clear, normal light reflex with TMs without erythema, bulging. Good dentition. No erythema, swelling, or exudate on post pharynx. Tonsils not swollen or erythematous. Hearing normal.  Neck: Supple, thyroid normal. No bruits  Respiratory: Respiratory effort normal, BS equal bilaterally without rales, rhonchi, wheezing or stridor.  Cardio: RRR without murmurs, rubs or gallops. Brisk peripheral pulses without edema.  Chest: symmetric, with normal excursions and percussion.  Breasts: Symmetric, without lumps, nipple discharge, retractions.  Abdomen: Soft, nontender, no guarding, rebound, hernias, masses, or organomegaly.  Lymphatics: Non tender without lymphadenopathy.  Genitourinary:  Musculoskeletal: Full ROM all peripheral extremities,5/5 strength, and normal gait.  Skin: Warm, dry without rashes, lesions, ecchymosis. Neuro: Cranial nerves intact, reflexes equal  bilaterally. Normal muscle tone, no cerebellar symptoms. Sensation intact.  Psych: Awake and oriented X 3, normal affect, Insight and Judgment  appropriate.   EKG: WNL no ST changes. AORTA SCAN: WNL   Patrick Sohm E  12:40 PM [facility]

## 2023-03-07 ENCOUNTER — Ambulatory Visit (INDEPENDENT_AMBULATORY_CARE_PROVIDER_SITE_OTHER): Payer: 59 | Admitting: Nurse Practitioner

## 2023-03-07 ENCOUNTER — Encounter: Payer: Self-pay | Admitting: Nurse Practitioner

## 2023-03-07 VITALS — BP 122/64 | HR 67 | Temp 97.7°F | Ht 66.0 in | Wt 144.0 lb

## 2023-03-07 DIAGNOSIS — D709 Neutropenia, unspecified: Secondary | ICD-10-CM | POA: Diagnosis not present

## 2023-03-07 DIAGNOSIS — I6529 Occlusion and stenosis of unspecified carotid artery: Secondary | ICD-10-CM

## 2023-03-07 DIAGNOSIS — E782 Mixed hyperlipidemia: Secondary | ICD-10-CM | POA: Diagnosis not present

## 2023-03-07 DIAGNOSIS — Z1329 Encounter for screening for other suspected endocrine disorder: Secondary | ICD-10-CM | POA: Diagnosis not present

## 2023-03-07 LAB — CBC WITH DIFFERENTIAL/PLATELET
Absolute Monocytes: 285 cells/uL (ref 200–950)
Basophils Absolute: 31 cells/uL (ref 0–200)
Basophils Relative: 1 %
Eosinophils Absolute: 22 cells/uL (ref 15–500)
Eosinophils Relative: 0.7 %
HCT: 39.4 % (ref 35.0–45.0)
Hemoglobin: 13.2 g/dL (ref 11.7–15.5)
Lymphs Abs: 1104 cells/uL (ref 850–3900)
MCH: 30.2 pg (ref 27.0–33.0)
MCHC: 33.5 g/dL (ref 32.0–36.0)
MCV: 90.2 fL (ref 80.0–100.0)
MPV: 11.4 fL (ref 7.5–12.5)
Monocytes Relative: 9.2 %
Neutro Abs: 1659 cells/uL (ref 1500–7800)
Neutrophils Relative %: 53.5 %
Platelets: 210 10*3/uL (ref 140–400)
RBC: 4.37 10*6/uL (ref 3.80–5.10)
RDW: 11.7 % (ref 11.0–15.0)
Total Lymphocyte: 35.6 %
WBC: 3.1 10*3/uL — ABNORMAL LOW (ref 3.8–10.8)

## 2023-03-07 NOTE — Patient Instructions (Signed)
Atherosclerosis  Atherosclerosis is when plaque builds up in the arteries. This causes narrowing and hardening of the arteries. Arteries are blood vessels that carry blood from the heart to all parts of the body. This blood contains oxygen. Plaque occurs due to inflammation or from a buildup of fat, cholesterol, calcium, waste products of cells, and a clotting material in the blood (fibrin). Plaque decreases the amount of blood that can flow through the artery. Atherosclerosis can affect any artery in your body, including: Heart arteries. Damage to these arteries may lead to coronary artery disease, which can cause a heart attack. Brain arteries. Damage to these arteries may cause a stroke. Leg, arm, and pelvis arteries. Peripheral artery disease (PAD) may result from damage to these arteries. Kidney arteries. Kidney (renal) failure may result from damage to kidney arteries. Treatment may slow the disease and prevent further damage to your heart, brain, peripheral arteries, and kidneys. What are the causes? This condition develops slowly over many years. The inner layers of your arteries become damaged and allow the gradual buildup of plaque. The exact cause of atherosclerosis is not fully understood. Symptoms of atherosclerosis do not occur until an artery becomes narrow or blocked. What increases the risk? The following factors may make you more likely to develop this condition: Being middle-aged or older. Certain medical conditions, including: High blood pressure. High cholesterol. High blood fats (triglycerides). Diabetes. Sleep apnea. Obesity. Certain lab levels, including: Elevated C-reactive protein (CRP). This is a sign of increased inflammation in your body. Elevated homocysteine levels. This is an amino acid that is associated with heart and blood vessel disease. Using tobacco or nicotine products. A family history of atherosclerosis. Not exercising enough (sedentary  lifestyle). Being stressed. Drinking too much alcohol or using drugs, such as cocaine or methamphetamine. What are the signs or symptoms? Symptoms of atherosclerosis do not occur until the plaque severely narrows or blocks the artery, which decreases blood flow. Sometimes, atherosclerosis does not cause symptoms. Symptoms of this condition include: Coronary artery disease. This may cause chest pain and shortness of breath. Decreased blood supply to your brain, which may cause a stroke. Signs of a stroke may include sudden: Weakness or numbness in your face, arm, or leg, especially on one side of your body. Trouble walking or difficulty moving your arms or legs. Loss of balance or coordination. Confusion. Slurred speech. Trouble speaking, or trouble understanding speech, or both (aphasia). Vision changes in one or both eyes. This may be double vision, blurred vision, or loss of vision. Severe headache with no known cause. The headache is often described as the worst headache ever experienced. PAD, which may cause pain, numbness, or nonhealing wounds, often in your legs and hips. Renal failure. This may cause tiredness, problems with urination, swelling, and itchy skin. How is this diagnosed? This condition is diagnosed based on your medical history and a physical exam. During the exam, your health care provider will: Check your pulse in different places. Listen for a "whooshing" sound over your arteries (bruit). You may also have tests, such as: Blood tests to check your levels of cholesterol, triglycerides, blood sugar, and CRP. Ankle-brachial index to compare blood pressure in your arms to blood pressure in your ankles to see how your blood is flowing. Heart (cardiac) tests. Electrocardiogram (ECG) to check for heart damage. Stress test to see how your heart reacts to exercise. Ultrasound tests. Ultrasound of your peripheral arteries to check blood flow. Echocardiogram to get images of  your heart's  chambers and valves. X-ray tests. Chest X-ray to see if you have an enlarged heart, which is a sign of heart failure. CT scan to check for damage to your heart, brain, or arteries. Angiogram. This is a test where dye is injected and X-rays are used to see the blood flow in the arteries. How is this treated? This condition is treated with lifestyle changes as the first step. These may include: Changing your diet. Losing weight. Reducing stress. Exercising and being physically active more regularly. Quitting smoking. You may also need medicine to: Lower triglycerides and cholesterol. Control blood pressure. Prevent blood clots. Lower inflammation in your body. Control your blood sugar. Sometimes, surgery is needed to: Remove plaque from an artery (endarterectomy). Open or widen a narrowed heart artery or peripheral artery (angioplasty). Create a new path for your blood with one of these procedures: Heart (coronary) artery bypass graft surgery. Peripheral artery bypass graft surgery. Place a small mesh tube (stent) in an artery to open or widen a narrowed artery. Follow these instructions at home: Eating and drinking  Eat a heart-healthy diet. Talk with your health care provider or a dietitian if you need help. A heart-healthy diet involves: Limiting unhealthy fats and increasing healthy fats. Some examples of healthy fats are avocados and olive oil. Eating plant-based foods, such as fruits, vegetables, nuts, whole grains, and legumes (such as peas and lentils). If you drink alcohol: Limit how much you have to: 0-1 drink a day for women who are not pregnant. 0-2 drinks a day for men. Know how much alcohol is in a drink. In the [place] one drink equals one 12 oz bottle of beer (355 mL), one 5 oz glass of wine (148 mL), or one 1 oz glass of hard liquor (44 mL). Lifestyle  Maintain a healthy weight. Lose weight if your health care provider says that you need to do  that. Follow an exercise program as told by your health care provider. Do not use any products that contain nicotine or tobacco. These products include cigarettes, chewing tobacco, and vaping devices, such as e-cigarettes. If you need help quitting, ask your health care provider. Do not use drugs. General instructions Take over-the-counter and prescription medicines only as told by your health care provider. Manage other health conditions as told. Keep all follow-up visits. This is important. Contact a health care provider if you have: An irregular heartbeat. Unexplained tiredness (fatigue). Trouble urinating, or you are producing less urine or foamy urine. Swelling of your hands or feet, or itchy skin. Unexplained pain or numbness in your legs or hips. A wound that is slow to heal or is not healing. Get help right away if: You have any symptoms of a heart attack. These may be: Chest pain. This includes squeezing chest pain that may feel like indigestion (angina). Shortness of breath. Pain in your neck, jaw, arms, back, or stomach. Cold sweat. Nausea. Light-headedness. Sudden pain, numbness, or coldness in a limb. You have any symptoms of a stroke. "BE FAST" is an easy way to remember the main warning signs of a stroke: B - Balance. Signs are dizziness, sudden trouble walking, or loss of balance. E - Eyes. Signs are trouble seeing or a sudden change in vision. F - Face. Signs are sudden weakness or numbness of the face, or the face or eyelid drooping on one side. A - Arms. Signs are weakness or numbness in an arm. This happens suddenly and usually on one side of the body. S -  Speech. Signs are sudden trouble speaking, slurred speech, or trouble understanding what people say. T - Time. Time to call emergency services. Write down what time symptoms started. You have other signs of a stroke, such as: A sudden, severe headache with no known cause. Nausea or vomiting. Seizure. These  symptoms may represent a serious problem that is an emergency. Do not wait to see if the symptoms will go away. Get medical help right away. Call your local emergency services (911 in the [place] Do not drive yourself to the hospital. Summary Atherosclerosis is when plaque builds up in the arteries and causes narrowing and hardening of the arteries. Plaque occurs due to inflammation or from a buildup of fat, cholesterol, calcium, cellular waste products, and fibrin. This condition may not cause any symptoms. Symptoms of atherosclerosis do not occur until the plaque severely narrows or blocks the artery. Treatment starts with lifestyle changes and may include medicines. In some cases, surgery is needed. Get help right away if you have any symptoms of a heart attack or stroke. This information is not intended to replace advice given to you by your health care provider. Make sure you discuss any questions you have with your health care provider. Document Revised: 10/28/2020 Document Reviewed: 10/28/2020 Elsevier Patient Education  2024 [employer].

## 2023-03-23 ENCOUNTER — Encounter: Payer: Self-pay | Admitting: Nurse Practitioner

## 2023-03-24 ENCOUNTER — Other Ambulatory Visit: Payer: Self-pay | Admitting: Nurse Practitioner

## 2023-03-24 ENCOUNTER — Telehealth: Payer: Self-pay | Admitting: Nurse Practitioner

## 2023-03-24 DIAGNOSIS — R070 Pain in throat: Secondary | ICD-10-CM

## 2023-03-24 NOTE — Telephone Encounter (Signed)
[facility] called about PT's order. She said the order sent, does not have enough info. Is it a mass?

## 2023-03-27 ENCOUNTER — Encounter: Payer: Self-pay | Admitting: Physician Assistant

## 2023-03-28 ENCOUNTER — Ambulatory Visit
Admission: RE | Admit: 2023-03-28 | Discharge: 2023-03-28 | Disposition: A | Discharge status: Home / Self Care | Payer: 59 | Source: Ambulatory Visit | Attending: Nurse Practitioner | Admitting: Nurse Practitioner

## 2023-03-28 DIAGNOSIS — R070 Pain in throat: Secondary | ICD-10-CM

## 2023-04-04 ENCOUNTER — Other Ambulatory Visit: Payer: Self-pay | Admitting: Nurse Practitioner

## 2023-04-04 ENCOUNTER — Encounter: Payer: Self-pay | Admitting: Nurse Practitioner

## 2023-04-04 DIAGNOSIS — R070 Pain in throat: Secondary | ICD-10-CM

## 2023-06-12 ENCOUNTER — Other Ambulatory Visit: Payer: Self-pay | Admitting: *Deleted

## 2023-06-12 DIAGNOSIS — I6529 Occlusion and stenosis of unspecified carotid artery: Secondary | ICD-10-CM

## 2023-06-20 ENCOUNTER — Ambulatory Visit (INDEPENDENT_AMBULATORY_CARE_PROVIDER_SITE_OTHER): Payer: 59 | Admitting: Vascular Surgery

## 2023-06-20 ENCOUNTER — Ambulatory Visit (HOSPITAL_COMMUNITY)
Admission: RE | Admit: 2023-06-20 | Discharge: 2023-06-20 | Disposition: A | Discharge status: Home / Self Care | Payer: 59 | Source: Ambulatory Visit | Attending: Vascular Surgery | Admitting: Vascular Surgery

## 2023-06-20 ENCOUNTER — Encounter: Payer: Self-pay | Admitting: Vascular Surgery

## 2023-06-20 VITALS — BP 118/79 | HR 64 | Temp 98.4°F | Resp 18 | Ht 66.75 in | Wt 147.5 lb

## 2023-06-20 DIAGNOSIS — I6522 Occlusion and stenosis of left carotid artery: Secondary | ICD-10-CM | POA: Diagnosis not present

## 2023-06-20 DIAGNOSIS — I6529 Occlusion and stenosis of unspecified carotid artery: Secondary | ICD-10-CM | POA: Insufficient documentation

## 2023-06-20 DIAGNOSIS — I779 Disorder of arteries and arterioles, unspecified: Secondary | ICD-10-CM | POA: Insufficient documentation

## 2023-06-20 NOTE — Progress Notes (Signed)
Patient name: Stacey Davidson MRN: [id number] DOB: Oct 02, 1975 Sex: female  REASON FOR CONSULT: Carotid artery disease  HPI: Stacey Davidson is a 47 y.o. female, with hx HLD that presents for surveillance of her carotid artery disease.  She states this was discovered several years ago when her PCP heard a bruit on the left side of her neck.  She has been followed at [facility] by Dr. Alwyn Ren.  States it is now easier for her to come to [place].  Sounds like her last ultrasound she reports showing around 40% stenosis (unable to view report in care everywhere).  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  She denies any history of strokes or TIAs.  She is on aspirin and statin.  States she dropped her statin to 20 mg.  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   husband now deceased    Family History  Problem Relation Age of Onset   Hypertension Mother    Hyperlipidemia Mother    Hypothyroidism Mother    Hypertension Brother    Colon cancer Maternal Aunt    Congestive Heart Failure Maternal Aunt    Congestive Heart Failure Maternal Uncle    Prostate cancer Maternal Uncle    Prostate cancer Maternal Uncle    Ovarian cancer Cousin     SOCIAL HISTORY: Social History   Socioeconomic History   Marital status: Widowed    Spouse name: Not on file   Number of children: Not on file   Years of education: Not on file   Highest education level: Not on file  Occupational History   Not on file  Tobacco Use   Smoking status: Never   Smokeless tobacco: Never  Vaping Use   Vaping status: Never Used  Substance and Sexual Activity   Alcohol use: Yes    Alcohol/week: 1.0 standard drink of alcohol    Types: 1 Glasses of wine per week    Comment: Occ   Drug use: No   Sexual activity: Not Currently    Birth  control/protection: None  Other Topics Concern   Not on file  Social History Narrative   Not on file   Social Determinants of Health   Financial Resource Strain: Low Risk  (12/28/2021)   [facility] visits prior to 10/08/2022.   Overall Financial Resource Strain (CARDIA)    Difficulty of Paying Living Expenses: Not hard at all  Food Insecurity: Low Risk  (05/04/2023)   [facility]   Hunger Vital Sign    Worried About Running Out of Food in the Last Year: Never true    Ran Out of Food in the Last Year: Never true  Transportation Needs: No Transportation Needs (05/04/2023)   Received from [facility]    In the past 12 months, has lack of reliable transportation kept you from medical appointments, meetings, work or from getting things needed for daily living? : No  Physical Activity: Insufficiently Active (12/28/2021)   [facility] visits prior to 10/08/2022[facility] visits prior to 10/08/2022.   Exercise Vital Sign    Days of Exercise  per Week: 3 days    Minutes of Exercise per Session: 20 min  Stress: No Stress Concern Present (12/28/2021)   [facility] prior to 10/08/2022[facility] visits prior to 10/08/2022.   [employer] of Occupational Health - Occupational Stress Questionnaire    Feeling of Stress : Not at all  Social Connections: Moderately Integrated (12/28/2021)   [facility] visits prior to 10/08/2022., [facility] visits prior to 10/08/2022.   Social Advertising account executive [NHANES]    Frequency of Communication with Friends and Family: More than  three times a week    Frequency of Social Gatherings with Friends and Family: Twice a week    Attends Religious Services: 1 to 4 times per year    Active Member of [employer] or Organizations: Yes    Attends Banker Meetings: 1 to 4 times per year    Marital Status: Widowed  Intimate Partner Violence: Not At Risk (12/28/2021)   [facility] prior to 10/08/2022., [facility] visits prior to 10/08/2022.   Humiliation, Afraid, Rape, and Kick questionnaire    Fear of Current or Ex-Partner: No    Emotionally Abused: No    Physically Abused: No    Sexually Abused: No    No Known Allergies  Current Outpatient Medications  Medication Sig Dispense Refill   aspirin EC 81 MG tablet Take by mouth.     atorvastatin (LIPITOR) 20 MG tablet Take 20 mg by mouth daily.     cetirizine (ZYRTEC ALLERGY) 10 MG tablet Take 10 mg by mouth daily.     levonorgestrel (KYLEENA) 19.5 MG IUD One     Multiple Vitamin (MULTIVITAMIN WITH MINERALS) TABS tablet Take 1 tablet by mouth daily.     No current facility-administered medications for this visit.    REVIEW OF SYSTEMS:  [X]  denotes positive finding, [ ]  denotes negative finding Cardiac  Comments:  Chest pain or chest pressure:    Shortness of breath upon exertion:    Short of breath when lying flat:    Irregular heart rhythm:        Vascular    Pain in calf, thigh, or hip brought on by ambulation:    Pain in feet at night that wakes you up from your sleep:     Blood clot in your veins:    Leg swelling:         Pulmonary    Oxygen at home:    Productive cough:     Wheezing:         Neurologic    Sudden weakness in arms or legs:     Sudden numbness in arms or legs:     Sudden onset of difficulty speaking or slurred speech:    Temporary loss of vision in one eye:     Problems with dizziness:         Gastrointestinal    Blood in stool:     Vomited blood:          Genitourinary    Burning when urinating:     Blood in urine:        Psychiatric    Major depression:         Hematologic    Bleeding problems:    Problems with blood clotting too easily:  Skin    Rashes or ulcers:        Constitutional    Fever or chills:      PHYSICAL EXAM: Vitals:   06/20/23 1443 06/20/23 1449  BP: 115/67 118/79  Pulse: 64   Resp: 18   Temp: 98.4 F (36.9 C)   TempSrc: Temporal   SpO2: 99%   Weight: 147 lb 8 oz (66.9 kg)   Height: 5' 6.75" (1.695 m)     GENERAL: The patient is a well-nourished female, in no acute distress. The vital signs are documented above. CARDIAC: There is a regular rate and rhythm.  VASCULAR:  No neck incisions PULMONARY: No respiratory distress. ABDOMEN: Soft and non-tender. MUSCULOSKELETAL: There are no major deformities or cyanosis. NEUROLOGIC: No focal weakness or paresthesias are detected.  Cranial nerves II through XII grossly intact. SKIN: There are no ulcers or rashes noted. PSYCHIATRIC: The patient has a normal affect.  DATA:   Carotid duplex today shows normal right carotid artery with no stenosis and 40 to 59% left ICA stenosis in the mid to distal artery with no significant plaque appreciated.  Assessment/Plan:  47 y.o. female, with hx HLD the presents for surveillance of her carotid artery disease.  She has asymptomatic carotid disease that has previously been under surveillance at [facility].  Her duplex today shows a normal right carotid artery with no stenosis and a 40 to 59% left ICA stenosis although minimal plaque was observed with some tortuosity.  I discussed that for asymptomatic carotid disease we recommend medical management unless there is over 80% stenosis.  She has no criteria for surgical management.  She is on aspirin statin for risk reduction and medical management is appropriate at this time.  I will follow-up in 1 year for repeat studies.   Cephus Shelling, MD [facility] Office: [phone]

## 2023-06-30 ENCOUNTER — Other Ambulatory Visit: Payer: Self-pay

## 2023-06-30 DIAGNOSIS — I6529 Occlusion and stenosis of unspecified carotid artery: Secondary | ICD-10-CM

## 2023-07-13 NOTE — Progress Notes (Signed)
 Complete Physical  Assessment and Plan:  Stacey Davidson was seen today for annual exam.  Diagnoses and all orders for this visit:  Encounter for general adult medical examination with abnormal findings Mammogram UTD Colonoscopy UTD Pap UTS with GYN  Mixed hyperlipidemia Continue Atorvastatin 20 mg every day , diet and exercise -     COMPLETE METABOLIC PANEL WITH GFR -     Lipid panel  Stenosis of carotid artery, unspecified laterality Continue to follow with vascular surgery  Control cholesterol level  Neutropenia, unspecified type (HCC) -     CBC with Differential/Platelet  Screening for ischemic heart disease -     EKG 12-Lead  Screening for hematuria or proteinuria -     Urinalysis, Routine w reflex microscopic -     Microalbumin / creatinine urine ratio  Screening for thyroid disorder -     TSH  Medication management -     CBC with Differential/Platelet -     COMPLETE METABOLIC PANEL WITH GFR -     Magnesium -     Lipid panel -     TSH -     Hemoglobin A1C w/out eAG -     VITAMIN D 25 Hydroxy (Vit-D Deficiency, Fractures) -     EKG 12-Lead -     Urinalysis, Routine w reflex microscopic -     Microalbumin / creatinine urine ratio  Vitamin D deficiency -     VITAMIN D 25 Hydroxy (Vit-D Deficiency, Fractures)  Screening for diabetes mellitus -     Hemoglobin A1C w/out eAG    Discussed med's effects and SE's. Screening labs and tests as requested with regular follow-up as recommended. Over 40 minutes of exam, counseling, chart review, and complex, high level critical decision making was performed this visit.  Future Appointments  Date Time Provider Department Center  07/16/2024  9:00 AM Raynelle Noori, NP [facility] None    HPI  47 y.o. female  presents for a complete physical and follow up for has Neutropenia [facility]) and Carotid artery disease (HCC) on their problem list..  Stacey Davidson mood has been slightly down- has been reminding herself to do things with other.    Stacey Davidson blood pressure has been controlled at home without medication, today their BP is BP: 112/68 BP Readings from Last 3 Encounters:  07/17/23 112/68  06/20/23 118/79  03/07/23 122/64  Stacey Davidson does workout. Stacey Davidson denies chest pain, shortness of breath, dizziness.  BMI is Body mass index is 23.29 kg/m., Stacey Davidson has been working on diet and exercise. Wt Readings from Last 3 Encounters:  07/17/23 145 lb 6.4 oz (66 kg)  06/20/23 147 lb 8 oz (66.9 kg)  03/07/23 144 lb (65.3 kg)     Stacey Davidson is on cholesterol medication, atorvastatin 20 mg every day and denies myalgias. Stacey Davidson cholesterol is at goal. The cholesterol last visit was:   Lab Results  Component Value Date   CHOL 145 03/07/2023   HDL 76 03/07/2023   LDLCALC 58 03/07/2023   TRIG 37 03/07/2023   CHOLHDL 1.9 03/07/2023    Here for physical- will check A1c, urine, Vit D, CBC, CMP  Current Medications:  Current Outpatient Medications on File Prior to Visit  Medication Sig Dispense Refill   aspirin EC 81 MG tablet Take by mouth.     atorvastatin (LIPITOR) 20 MG tablet Take 20 mg by mouth daily.     cetirizine (ZYRTEC ALLERGY) 10 MG tablet Take 10 mg by mouth daily.     fexofenadine (  ALLEGRA) 180 MG tablet Take by mouth.     levonorgestrel (KYLEENA) 19.5 MG IUD One     Multiple Vitamin (MULTIVITAMIN WITH MINERALS) TABS tablet Take 1 tablet by mouth daily.     No current facility-administered medications on file prior to visit.   Allergies:  No Known Allergies Medical History:  Stacey Davidson has Neutropenia (HCC) and Carotid artery disease (HCC) on their problem list. 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 Family History:  Herfamily history includes Colon cancer in Stacey Davidson maternal aunt; Congestive Heart Failure in Stacey Davidson  maternal aunt and maternal uncle; Hyperlipidemia in Stacey Davidson mother; Hypertension in Stacey Davidson brother and mother; Hypothyroidism in Stacey Davidson mother; Ovarian cancer in Stacey Davidson cousin; Prostate cancer in Stacey Davidson maternal uncle and maternal uncle. Social History:  Stacey Davidson reports that Stacey Davidson has never smoked. Stacey Davidson has never used smokeless tobacco. Stacey Davidson reports current alcohol use of about 1.0 standard drink of alcohol per week. Stacey Davidson reports that Stacey Davidson does not use drugs.  Review of Systems: Review of Systems  Constitutional:  Negative for chills and fever.  HENT:  Negative for congestion, hearing loss, sinus pain, sore throat and tinnitus.   Eyes:  Negative for blurred vision and double vision.  Respiratory:  Negative for cough, hemoptysis, sputum production, shortness of breath and wheezing.   Cardiovascular:  Negative for chest pain, palpitations and leg swelling.  Gastrointestinal:  Negative for abdominal pain, constipation, diarrhea, heartburn, nausea and vomiting.  Genitourinary:  Negative for dysuria and urgency.  Musculoskeletal:  Negative for back pain, falls, joint pain, myalgias and neck pain.  Skin:  Negative for rash.  Neurological:  Negative for dizziness, tingling, tremors, weakness and headaches.  Endo/Heme/Allergies:  Does not bruise/bleed easily.  Psychiatric/Behavioral:  Negative for depression and suicidal ideas. The patient is not nervous/anxious and does not have insomnia.     Physical Exam: Estimated body mass index is 23.29 kg/m as calculated from the following:   Height as of this encounter: 5' 6.25" (1.683 m).   Weight as of this encounter: 145 lb 6.4 oz (66 kg). BP 112/68   Pulse 74   Temp 97.9 F (36.6 C)   Ht 5' 6.25" (1.683 m)   Wt 145 lb 6.4 oz (66 kg)   SpO2 99%   BMI 23.29 kg/m  General Appearance: Well nourished, in no apparent distress.  Eyes: PERRLA, EOMs, conjunctiva no swelling or erythema Sinuses: No Frontal/maxillary tenderness  ENT/Mouth: Ext aud canals clear, normal light  reflex with TMs without erythema, bulging. Good dentition. No erythema, swelling, or exudate on post pharynx.  Hearing normal.  Neck: Supple, thyroid normal. No bruits  Respiratory: Respiratory effort normal, BS equal bilaterally without rales, rhonchi, wheezing or stridor.  Cardio: RRR without murmurs, rubs or gallops. Brisk peripheral pulses without edema.  Chest: symmetric, with normal excursions and percussion.  Breasts: Defer to GYN  Abdomen: Positive bowel sounds all 4 quadrants, Soft, nontender, no guarding, rebound, hernias, masses, or organomegaly.  Lymphatics: Non tender without lymphadenopathy.  Genitourinary:Defer to GYN Musculoskeletal: Full ROM all peripheral extremities,5/5 strength, and normal gait.  Skin: Warm, dry without rashes, lesions, ecchymosis. Neuro: Cranial nerves intact, reflexes equal bilaterally. Normal muscle tone, no cerebellar symptoms. Sensation intact.  Psych: Awake and oriented X 3, normal affect, Insight and Judgment appropriate.   EKG: Sinus  bradycardia,  no ST changes.     Gesenia Bantz E  9:26 AM [facility]

## 2023-07-17 ENCOUNTER — Encounter: Payer: 59 | Admitting: Nurse Practitioner

## 2023-07-17 ENCOUNTER — Encounter: Payer: Self-pay | Admitting: Nurse Practitioner

## 2023-07-17 ENCOUNTER — Ambulatory Visit (INDEPENDENT_AMBULATORY_CARE_PROVIDER_SITE_OTHER): Payer: 59 | Admitting: Nurse Practitioner

## 2023-07-17 VITALS — BP 112/68 | HR 74 | Temp 97.9°F | Ht 66.25 in | Wt 145.4 lb

## 2023-07-17 DIAGNOSIS — I10 Essential (primary) hypertension: Secondary | ICD-10-CM

## 2023-07-17 DIAGNOSIS — Z136 Encounter for screening for cardiovascular disorders: Secondary | ICD-10-CM

## 2023-07-17 DIAGNOSIS — Z1389 Encounter for screening for other disorder: Secondary | ICD-10-CM

## 2023-07-17 DIAGNOSIS — I6529 Occlusion and stenosis of unspecified carotid artery: Secondary | ICD-10-CM

## 2023-07-17 DIAGNOSIS — Z0000 Encounter for general adult medical examination without abnormal findings: Secondary | ICD-10-CM

## 2023-07-17 DIAGNOSIS — Z0001 Encounter for general adult medical examination with abnormal findings: Secondary | ICD-10-CM

## 2023-07-17 DIAGNOSIS — Z1329 Encounter for screening for other suspected endocrine disorder: Secondary | ICD-10-CM

## 2023-07-17 DIAGNOSIS — Z79899 Other long term (current) drug therapy: Secondary | ICD-10-CM

## 2023-07-17 DIAGNOSIS — Z131 Encounter for screening for diabetes mellitus: Secondary | ICD-10-CM

## 2023-07-17 DIAGNOSIS — D709 Neutropenia, unspecified: Secondary | ICD-10-CM

## 2023-07-17 DIAGNOSIS — E782 Mixed hyperlipidemia: Secondary | ICD-10-CM

## 2023-07-17 DIAGNOSIS — E559 Vitamin D deficiency, unspecified: Secondary | ICD-10-CM

## 2023-07-17 NOTE — Patient Instructions (Signed)

## 2023-07-18 ENCOUNTER — Encounter: Payer: Self-pay | Admitting: Nurse Practitioner

## 2023-07-18 ENCOUNTER — Other Ambulatory Visit: Payer: Self-pay | Admitting: Nurse Practitioner

## 2023-07-18 DIAGNOSIS — D709 Neutropenia, unspecified: Secondary | ICD-10-CM

## 2023-07-18 LAB — CBC WITH DIFFERENTIAL/PLATELET
Absolute Lymphocytes: 998 {cells}/uL (ref 850–3900)
Absolute Monocytes: 238 {cells}/uL (ref 200–950)
Basophils Absolute: 40 {cells}/uL (ref 0–200)
Basophils Relative: 1.6 %
Eosinophils Absolute: 70 {cells}/uL (ref 15–500)
Eosinophils Relative: 2.8 %
HCT: 41.5 % (ref 35.0–45.0)
Hemoglobin: 13.2 g/dL (ref 11.7–15.5)
MCH: 29.4 pg (ref 27.0–33.0)
MCHC: 31.8 g/dL — ABNORMAL LOW (ref 32.0–36.0)
MCV: 92.4 fL (ref 80.0–100.0)
MPV: 10.9 fL (ref 7.5–12.5)
Monocytes Relative: 9.5 %
Neutro Abs: 1155 {cells}/uL — ABNORMAL LOW (ref 1500–7800)
Neutrophils Relative %: 46.2 %
Platelets: 204 10*3/uL (ref 140–400)
RBC: 4.49 10*6/uL (ref 3.80–5.10)
RDW: 11.7 % (ref 11.0–15.0)
Total Lymphocyte: 39.9 %
WBC: 2.5 10*3/uL — ABNORMAL LOW (ref 3.8–10.8)

## 2023-07-18 LAB — COMPLETE METABOLIC PANEL WITH GFR
AG Ratio: 1.5 (calc) (ref 1.0–2.5)
ALT: 11 U/L (ref 6–29)
AST: 15 U/L (ref 10–35)
Albumin: 4.1 g/dL (ref 3.6–5.1)
Alkaline phosphatase (APISO): 77 U/L (ref 31–125)
BUN: 9 mg/dL (ref 7–25)
CO2: 27 mmol/L (ref 20–32)
Calcium: 9.2 mg/dL (ref 8.6–10.2)
Chloride: 106 mmol/L (ref 98–110)
Creat: 0.93 mg/dL (ref 0.50–0.99)
Globulin: 2.7 g/dL (ref 1.9–3.7)
Glucose, Bld: 81 mg/dL (ref 65–99)
Potassium: 4 mmol/L (ref 3.5–5.3)
Sodium: 140 mmol/L (ref 135–146)
Total Bilirubin: 0.6 mg/dL (ref 0.2–1.2)
Total Protein: 6.8 g/dL (ref 6.1–8.1)
eGFR: 75 mL/min/{1.73_m2} (ref >=60)

## 2023-07-18 LAB — URINALYSIS, ROUTINE W REFLEX MICROSCOPIC
Bacteria, UA: NONE SEEN /[HPF]
Bilirubin Urine: NEGATIVE
Glucose, UA: NEGATIVE
Hyaline Cast: NONE SEEN /[LPF]
Ketones, ur: NEGATIVE
Leukocytes,Ua: NEGATIVE
Nitrite: NEGATIVE
Protein, ur: NEGATIVE
RBC / HPF: NONE SEEN /[HPF] (ref 0–2)
Specific Gravity, Urine: 1.012 (ref 1.001–1.035)
WBC, UA: NONE SEEN /[HPF] (ref 0–5)
pH: 6 (ref 5.0–8.0)

## 2023-07-18 LAB — MAGNESIUM: Magnesium: 2 mg/dL (ref 1.5–2.5)

## 2023-07-18 LAB — MICROALBUMIN / CREATININE URINE RATIO
Creatinine, Urine: 97 mg/dL (ref 20–275)
Microalb Creat Ratio: 3 mg/g{creat} (ref <30)
Microalb, Ur: 0.3 mg/dL

## 2023-07-18 LAB — MICROSCOPIC MESSAGE

## 2023-07-18 LAB — VITAMIN D 25 HYDROXY (VIT D DEFICIENCY, FRACTURES): Vit D, 25-Hydroxy: 16 ng/mL — ABNORMAL LOW (ref 30–100)

## 2023-07-18 LAB — LIPID PANEL
Cholesterol: 150 mg/dL (ref <200)
HDL: 79 mg/dL (ref >=50)
LDL Cholesterol (Calc): 61 mg/dL
Non-HDL Cholesterol (Calc): 71 mg/dL (ref <130)
Total CHOL/HDL Ratio: 1.9 (calc) (ref <5.0)
Triglycerides: 35 mg/dL (ref <150)

## 2023-07-18 LAB — TSH: TSH: 1.55 m[IU]/L

## 2023-07-18 LAB — HEMOGLOBIN A1C W/OUT EAG: Hgb A1c MFr Bld: 5.2 %{Hb} (ref <5.7)

## 2023-07-21 ENCOUNTER — Telehealth: Payer: Self-pay | Admitting: Nurse Practitioner

## 2023-08-06 ENCOUNTER — Other Ambulatory Visit: Payer: Self-pay | Admitting: Nurse Practitioner

## 2023-08-06 DIAGNOSIS — D709 Neutropenia, unspecified: Secondary | ICD-10-CM

## 2023-08-06 NOTE — Progress Notes (Unsigned)
Patient Care Team: Lucky Cowboy, MD as PCP - General (Internal Medicine)   CHIEF COMPLAINT: Follow-up leukopenia  CURRENT THERAPY: Observation  INTERVAL HISTORY Stacey Davidson returns for follow-up, last seen by Georga Kaufmann, PA 07/12/2022, at which time neutropenia had resolved and WBC was nearly normal so the plan was for follow-up if needed.  Recent labs 07/17/2023 by PCP showed WBC 2.5, ANC 1.1 and she was instructed to call us.  She feels well in her normal state of health.  Denies chronic or recurrent infections.  Eating and drinking well without unintentional weight loss.  Does not exercise much.  ROS  All other systems reviewed and negative  Past Medical History:  Diagnosis Date   Hyperlipidemia    Neutropenia [facility])      Past Surgical History:  Procedure Laterality Date   GYNECOLOGIC CRYOSURGERY  2006   KIDNEY DONATION  2008/08/25   husband now deceased     Outpatient Encounter Medications as of 08/07/2023  Medication Sig   aspirin EC 81 MG tablet Take by mouth.   atorvastatin (LIPITOR) 20 MG tablet Take 20 mg by mouth daily.   cetirizine (ZYRTEC ALLERGY) 10 MG tablet Take 10 mg by mouth daily.   levonorgestrel (KYLEENA) 19.5 MG IUD One   Multiple Vitamin (MULTIVITAMIN WITH MINERALS) TABS tablet Take 1 tablet by mouth daily.   fexofenadine (ALLEGRA) 180 MG tablet Take by mouth. (Patient not taking: Reported on 08/07/2023)   No facility-administered encounter medications on file as of 08/07/2023.     Today's Vitals   08/07/23 0831 08/07/23 0840  BP: 110/61   Pulse: 80   Resp: 15   Temp: 97.9 F (36.6 C)   TempSrc: Temporal   SpO2: 100%   Weight: 146 lb 4.8 oz (66.4 kg)   PainSc:  0-No pain   Body mass index is 23.44 kg/m.   PHYSICAL EXAM GENERAL:alert, no distress and comfortable SKIN: no rash  EYES: sclera clear NECK: without mass LYMPH:  no palpable cervical or supraclavicular lymphadenopathy  LUNGS: clear with normal breathing effort HEART:  regular rate & rhythm, no lower extremity edema ABDOMEN: abdomen soft, non-tender and normal bowel sounds NEURO: alert & oriented x 3 with fluent speech, no focal motor/sensory deficits   CBC    Component Value Date/Time   WBC 3.9 (L) 08/07/2023 0811   WBC 2.5 (L) 07/17/2023 0959   RBC 4.37 08/07/2023 0811   HGB 13.3 08/07/2023 0811   HCT 39.8 08/07/2023 0811   PLT 192 08/07/2023 0811   MCV 91.1 08/07/2023 0811   MCH 30.4 08/07/2023 0811   MCHC 33.4 08/07/2023 0811   RDW 11.9 08/07/2023 0811   LYMPHSABS 1.5 08/07/2023 0811   MONOABS 0.3 08/07/2023 0811   EOSABS 0.1 08/07/2023 0811   BASOSABS 0.0 08/07/2023 0811     CMP     Component Value Date/Time   NA 140 07/17/2023 0959   K 4.0 07/17/2023 0959   CL 106 07/17/2023 0959   CO2 27 07/17/2023 0959   GLUCOSE 81 07/17/2023 0959   BUN 9 07/17/2023 0959   CREATININE 0.93 07/17/2023 0959   CALCIUM 9.2 07/17/2023 0959   PROT 6.8 07/17/2023 0959   ALBUMIN 4.0 07/12/2022 1504   AST 15 07/17/2023 0959   AST 12 (L) 07/12/2022 1504   ALT 11 07/17/2023 0959   ALT 11 07/12/2022 1504   ALKPHOS 64 07/12/2022 1504   BILITOT 0.6 07/17/2023 0959   BILITOT 0.6 07/12/2022 1504   GFRNONAA >60 07/12/2022  1504   GFRAA >60 04/03/2020 2159     ASSESSMENT & PLAN: 47 year old female  Leukopenia/neutropenia -WBC normal in 2015, next lab in epic 04/03/2020 showed WBC 3.7, ANC down to 1.2 in 12/2021  -Previous workup including hepatitis panel, HIV, B12, MMA, folic acid, CRP and sed rate were all normal -Given the intermittent and mild nature, this is very likely benign, 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 No recurrent infections or other red flags. Today's WBC is 3.9, ANC 1.9, no other cytopenias or other concerns -We reviewed infection precautions -Offered PCP f/up or to see [place] in 1 year with lab in 6 and 12 months which she prefers. -Knows to call sooner if needed - such as severe neutropenia or other new  cytopenias   PLAN: -Previous work up and today's labs reviewed -Continue observation and infection precautions -Lab q6 mo -F/up in 1 year, then likely PRN if stable - knows to call sooner if needed -CC note to PCP   Orders Placed This Encounter  Procedures   CBC with Differential (Cancer Center Only)    Standing Status:   Standing    Number of Occurrences:   5    Expiration Date:   08/06/2024      All questions were answered. The patient knows to call the clinic with any problems, questions or concerns. No barriers to learning were detected. I spent 20 minutes counseling the patient face to face. The total time spent in the appointment was 30 minutes and more than 50% was on counseling, review of test results, and coordination of care.   Santiago Glad, NP-C 08/07/2023

## 2023-08-07 ENCOUNTER — Encounter: Payer: Self-pay | Admitting: Nurse Practitioner

## 2023-08-07 ENCOUNTER — Inpatient Hospital Stay: Payer: 59 | Attending: Internal Medicine

## 2023-08-07 ENCOUNTER — Inpatient Hospital Stay (HOSPITAL_BASED_OUTPATIENT_CLINIC_OR_DEPARTMENT_OTHER): Payer: 59 | Admitting: Nurse Practitioner

## 2023-08-07 VITALS — BP 110/61 | HR 80 | Temp 97.9°F | Resp 15 | Wt 146.3 lb

## 2023-08-07 DIAGNOSIS — D709 Neutropenia, unspecified: Secondary | ICD-10-CM | POA: Diagnosis not present

## 2023-08-07 DIAGNOSIS — D72819 Decreased white blood cell count, unspecified: Secondary | ICD-10-CM | POA: Diagnosis present

## 2023-08-07 LAB — CBC WITH DIFFERENTIAL (CANCER CENTER ONLY)
Abs Immature Granulocytes: 0.01 10*3/uL (ref 0.00–0.07)
Basophils Absolute: 0 10*3/uL (ref 0.0–0.1)
Basophils Relative: 1 %
Eosinophils Absolute: 0.1 10*3/uL (ref 0.0–0.5)
Eosinophils Relative: 2 %
HCT: 39.8 % (ref 36.0–46.0)
Hemoglobin: 13.3 g/dL (ref 12.0–15.0)
Immature Granulocytes: 0 %
Lymphocytes Relative: 40 %
Lymphs Abs: 1.5 10*3/uL (ref 0.7–4.0)
MCH: 30.4 pg (ref 26.0–34.0)
MCHC: 33.4 g/dL (ref 30.0–36.0)
MCV: 91.1 fL (ref 80.0–100.0)
Monocytes Absolute: 0.3 10*3/uL (ref 0.1–1.0)
Monocytes Relative: 9 %
Neutro Abs: 1.9 10*3/uL (ref 1.7–7.7)
Neutrophils Relative %: 48 %
Platelet Count: 192 10*3/uL (ref 150–400)
RBC: 4.37 MIL/uL (ref 3.87–5.11)
RDW: 11.9 % (ref 11.5–15.5)
WBC Count: 3.9 10*3/uL — ABNORMAL LOW (ref 4.0–10.5)
nRBC: 0 % (ref 0.0–0.2)

## 2023-08-16 NOTE — Telephone Encounter (Signed)
 No longer a patient at our office. Patient needs to contact office regarding this.

## 2023-09-08 ENCOUNTER — Encounter: Payer: Self-pay | Admitting: Nurse Practitioner

## 2023-09-11 MED ORDER — ATORVASTATIN CALCIUM 20 MG PO TABS: 20.0000 mg | ORAL_TABLET | Freq: Every day | ORAL | 3 refills | Status: DC

## 2023-10-09 ENCOUNTER — Encounter: Payer: Self-pay | Admitting: Nurse Practitioner

## 2023-11-28 ENCOUNTER — Ambulatory Visit: Admitting: Family

## 2024-02-06 ENCOUNTER — Inpatient Hospital Stay: Payer: 59 | Attending: Physician Assistant

## 2024-02-06 DIAGNOSIS — D72819 Decreased white blood cell count, unspecified: Secondary | ICD-10-CM | POA: Insufficient documentation

## 2024-02-06 DIAGNOSIS — D709 Neutropenia, unspecified: Secondary | ICD-10-CM

## 2024-02-06 LAB — CBC WITH DIFFERENTIAL (CANCER CENTER ONLY)
Abs Immature Granulocytes: 0 10*3/uL (ref 0.00–0.07)
Basophils Absolute: 0 10*3/uL (ref 0.0–0.1)
Basophils Relative: 1 %
Eosinophils Absolute: 0.1 10*3/uL (ref 0.0–0.5)
Eosinophils Relative: 4 %
HCT: 39.5 % (ref 36.0–46.0)
Hemoglobin: 13.1 g/dL (ref 12.0–15.0)
Immature Granulocytes: 0 %
Lymphocytes Relative: 44 %
Lymphs Abs: 1.4 10*3/uL (ref 0.7–4.0)
MCH: 30.5 pg (ref 26.0–34.0)
MCHC: 33.2 g/dL (ref 30.0–36.0)
MCV: 91.9 fL (ref 80.0–100.0)
Monocytes Absolute: 0.3 10*3/uL (ref 0.1–1.0)
Monocytes Relative: 8 %
Neutro Abs: 1.4 10*3/uL — ABNORMAL LOW (ref 1.7–7.7)
Neutrophils Relative %: 43 %
Platelet Count: 203 10*3/uL (ref 150–400)
RBC: 4.3 MIL/uL (ref 3.87–5.11)
RDW: 11.9 % (ref 11.5–15.5)
WBC Count: 3.1 10*3/uL — ABNORMAL LOW (ref 4.0–10.5)
nRBC: 0 % (ref 0.0–0.2)

## 2024-02-07 ENCOUNTER — Ambulatory Visit: Payer: Self-pay | Admitting: Nurse Practitioner

## 2024-02-15 ENCOUNTER — Ambulatory Visit: Payer: 59 | Admitting: Nurse Practitioner

## 2024-04-25 ENCOUNTER — Ambulatory Visit: Admitting: Orthopaedic Surgery

## 2024-04-25 DIAGNOSIS — G8929 Other chronic pain: Secondary | ICD-10-CM | POA: Diagnosis not present

## 2024-04-25 DIAGNOSIS — M25562 Pain in left knee: Secondary | ICD-10-CM
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
## 2024-04-26 ENCOUNTER — Encounter: Payer: Self-pay | Admitting: Orthopedic Surgery

## 2024-04-26 ENCOUNTER — Ambulatory Visit (INDEPENDENT_AMBULATORY_CARE_PROVIDER_SITE_OTHER): Admitting: Orthopedic Surgery

## 2024-04-26 ENCOUNTER — Inpatient Hospital Stay (HOSPITAL_BASED_OUTPATIENT_CLINIC_OR_DEPARTMENT_OTHER): Admission: RE | Admit: 2024-04-26 | Source: Ambulatory Visit | Admitting: Radiology

## 2024-04-26 DIAGNOSIS — Z1231 Encounter for screening mammogram for malignant neoplasm of breast: Secondary | ICD-10-CM

## 2024-04-26 DIAGNOSIS — S83512D Sprain of anterior cruciate ligament of left knee, subsequent encounter: Secondary | ICD-10-CM | POA: Diagnosis not present
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
## 2024-04-29 ENCOUNTER — Telehealth: Payer: Self-pay

## 2024-04-29 NOTE — Telephone Encounter (Signed)
 S/W patient and she stated that she does not see a cardiologist only a doctor with [facility] (Dr Gretta) Told patient that I would call [facility] at [facility] to confirm that preop clearance is needed with cardiology.  Called [facility] at [facility] and they stated they would refax to Dr Gretta and to disregard preop clearance request at this time.  Called and informed pt that  [facility] is going to refax to [facility] (Dr Gretta) and that they will be in touch with her with what her next steps will need to be.

## 2024-04-29 NOTE — Telephone Encounter (Signed)
   Name: Stacey Davidson  DOB: 12-Jun-1976  MRN: [id number]  Primary Cardiologist: None  Chart reviewed as part of pre-operative protocol coverage. Patient has never been seen by our office before. Therefore, she needs a New Patient visit to get established with out group and for preoperative cardiovascular risk.  Pre-op covering staff: - Please schedule appointment and call patient to inform them. If patient already had an upcoming appointment within acceptable timeframe, please add pre-op clearance to the appointment notes so provider is aware. - Please contact requesting surgeon's office via preferred method (i.e, phone, fax) to inform them of need for appointment prior to surgery.   Nashali Ditmer E Johnatha Zeidman, PA-C  04/29/2024, 8:30 AM

## 2024-04-29 NOTE — Telephone Encounter (Signed)
   Pre-operative Risk Assessment    Patient Name: Stacey Davidson  DOB: 1976/01/29 MRN: [id number]   Date of last office visit: NONE Date of next office visit: NONE   Request for Surgical Clearance    Procedure:  LEFT KNEE ACL RECONSTRUCTION & LCL RECONSTRUCTION  Date of Surgery:  Clearance TBD                                Surgeon:  DR ADDIE Socks Group or Practice Name:  [facility] Phone number:  [phone] Fax number:  [phone]   Type of Clearance Requested:   - Medical  - Pharmacy:  Hold Aspirin     Type of Anesthesia:  General    Additional requests/questions:    SignedLucie DELENA Ku   04/29/2024, 8:00 AM

## 2024-04-30 ENCOUNTER — Ambulatory Visit: Admitting: Orthopaedic Surgery

## 2024-04-30 NOTE — Telephone Encounter (Signed)
 Ortho office calling to follow up with clearance. She complained the pre-op pool system is inefficient and confusing. She has had a hard time getting the clearance to [facility] and continued to get [facility].

## 2024-04-30 NOTE — Telephone Encounter (Signed)
 I sent a secure chat to Debbie Nardie at Dr. Sedrick office: question on this pt? Are you all referring her to cardiology for preop clearance, as it looks like we have never seen her.

## 2024-04-30 NOTE — Telephone Encounter (Signed)
 I will reach out to surgeon office to confirm if they are needing cardiac clearance, see previous notes;  Looks like new pt appt needed if pt needs cardiac clearance.

## 2024-04-30 NOTE — Telephone Encounter (Signed)
 No. she is a [facility] patient. I sent the clearance to the [phone] number because I thought the clearances were handled through you and the pre-op team at [facility] , but since yesterday I was provided with a separate number to call and fax for future reference. In the Epic phonebook, they have you all lumped together and shows Dr. Gaylene contact and fax number the same as [facility]'s. I think everything is fine now. No pre op appointment is needed from [facility] for her.

## 2024-05-03 ENCOUNTER — Telehealth: Payer: Self-pay | Admitting: Orthopedic Surgery

## 2024-05-03 NOTE — Telephone Encounter (Signed)
 IC patient,lmvm form patient to [facility]. Need to know if oow since 04/25/24 appt.

## 2024-05-03 NOTE — Telephone Encounter (Signed)
 Pt submitted medical release form short term and $20 payment

## 2024-05-07 ENCOUNTER — Other Ambulatory Visit: Payer: Self-pay | Admitting: Obstetrics and Gynecology

## 2024-05-07 ENCOUNTER — Telehealth: Payer: Self-pay

## 2024-05-07 DIAGNOSIS — Z1231 Encounter for screening mammogram for malignant neoplasm of breast: Secondary | ICD-10-CM

## 2024-05-07 DIAGNOSIS — S83512D Sprain of anterior cruciate ligament of left knee, subsequent encounter: Secondary | ICD-10-CM

## 2024-05-07 NOTE — Telephone Encounter (Signed)
 Pt called to hold 81 mg Aspirin for upcoming orthopedic surgery. Per APP, she can hold it for 5 days. I have let pt know. No further questions/concerns.

## 2024-05-07 NOTE — Telephone Encounter (Signed)
 Pt called stating they have information about theirleg that they want to give.

## 2024-05-08 ENCOUNTER — Encounter: Payer: Self-pay | Admitting: Orthopedic Surgery

## 2024-05-08 ENCOUNTER — Ambulatory Visit
Admission: RE | Admit: 2024-05-08 | Discharge: 2024-05-08 | Disposition: A | Discharge status: Home / Self Care | Source: Ambulatory Visit

## 2024-05-08 DIAGNOSIS — Z1231 Encounter for screening mammogram for malignant neoplasm of breast: Secondary | ICD-10-CM

## 2024-05-08 NOTE — Telephone Encounter (Signed)
 I called and advised pt of this, she stated understanding. Told pt I will have vascular reach out

## 2024-05-08 NOTE — Telephone Encounter (Signed)
 Stacey Davidson looks like she is getting this done tomorrow at 8am

## 2024-05-08 NOTE — Telephone Encounter (Signed)
 Spoke to sam PA-C and she advised to get u/s to r/o DVT

## 2024-05-08 NOTE — Telephone Encounter (Signed)
 I talked to the pt and she said her calf is really tight. No swelling. Is this normal or should I send her for a u/s? Please advise

## 2024-05-08 NOTE — Telephone Encounter (Signed)
Sounds like a good plan to me

## 2024-05-09 ENCOUNTER — Ambulatory Visit (HOSPITAL_COMMUNITY)
Admission: RE | Admit: 2024-05-09 | Discharge: 2024-05-09 | Disposition: A | Discharge status: Home / Self Care | Source: Ambulatory Visit | Attending: Orthopedic Surgery | Admitting: Orthopedic Surgery

## 2024-05-09 DIAGNOSIS — S83512D Sprain of anterior cruciate ligament of left knee, subsequent encounter: Secondary | ICD-10-CM | POA: Diagnosis present

## 2024-05-13 ENCOUNTER — Encounter: Payer: Self-pay | Admitting: Orthopedic Surgery

## 2024-05-13 ENCOUNTER — Telehealth: Payer: Self-pay

## 2024-05-13 ENCOUNTER — Other Ambulatory Visit: Payer: Self-pay | Admitting: Surgical

## 2024-05-13 DIAGNOSIS — S83232A Complex tear of medial meniscus, current injury, left knee, initial encounter: Secondary | ICD-10-CM | POA: Diagnosis not present

## 2024-05-13 DIAGNOSIS — S83512A Sprain of anterior cruciate ligament of left knee, initial encounter: Secondary | ICD-10-CM | POA: Diagnosis not present

## 2024-05-13 MED ORDER — METHOCARBAMOL 500 MG PO TABS: 500.0000 mg | ORAL_TABLET | Freq: Three times a day (TID) | ORAL | 1 refills | Status: DC | PRN

## 2024-05-13 MED ORDER — OXYCODONE HCL 5 MG PO TABS: 5.0000 mg | ORAL_TABLET | ORAL | 0 refills | Status: DC | PRN

## 2024-05-13 MED ORDER — CELECOXIB 100 MG PO CAPS: 100.0000 mg | ORAL_CAPSULE | Freq: Two times a day (BID) | ORAL | 0 refills | Status: DC

## 2024-05-13 MED ORDER — ASPIRIN 81 MG PO CHEW
81.0000 mg | CHEWABLE_TABLET | Freq: Two times a day (BID) | ORAL | 0 refills | Status: DC
Start: 2024-05-13 — End: 2024-06-05

## 2024-05-13 NOTE — Telephone Encounter (Signed)
 I called patient and discussed.  She has not heard from Gaffer with company.  [name], can we get Stacey Davidson set up for knee CPM within the next 2 days or so?  She just had surgery today.  Thank you

## 2024-05-13 NOTE — Telephone Encounter (Signed)
 Patient called concerning CPM machine.  Would like a CB at [phone].    Please advise.

## 2024-05-14 NOTE — Telephone Encounter (Signed)
 CPM order sent

## 2024-05-15 ENCOUNTER — Telehealth: Payer: Self-pay

## 2024-05-15 ENCOUNTER — Encounter: Payer: Self-pay | Admitting: Orthopedic Surgery

## 2024-05-15 NOTE — Telephone Encounter (Signed)
 Called and advised pt.

## 2024-05-15 NOTE — Telephone Encounter (Signed)
 Patient would clarification on removing dressing. Cb#  [phone].  Please advise.  Thank you

## 2024-05-15 NOTE — Telephone Encounter (Signed)
 Hey Autumn, she can remove Ace bandage but leave the clear waterproof dressing on in order to keep taking a shower.  Also to answer her other question, she can use CPM machine 3 times a day for 1 hour each session (for 3 hours total) but if she would like to do this more often, she is welcome to it.  We usually say 3 times a day for 1 hour each time is the minimum

## 2024-05-15 NOTE — Telephone Encounter (Signed)
 I called.  Base we want her doing that 1 hour 3 times a day to begin with.  We can increase it some after we see her back next week.  She is okay to do straight leg raises.  I do want to keep her nonweightbearing though for the first 4 weeks since we did the accessory ligament reconstruction

## 2024-05-19 ENCOUNTER — Encounter: Payer: Self-pay | Admitting: Physician Assistant

## 2024-05-21 ENCOUNTER — Encounter (HOSPITAL_BASED_OUTPATIENT_CLINIC_OR_DEPARTMENT_OTHER): Admitting: Radiology

## 2024-05-21 ENCOUNTER — Encounter (HOSPITAL_BASED_OUTPATIENT_CLINIC_OR_DEPARTMENT_OTHER): Payer: Self-pay

## 2024-05-22 ENCOUNTER — Ambulatory Visit (INDEPENDENT_AMBULATORY_CARE_PROVIDER_SITE_OTHER): Admitting: Orthopedic Surgery

## 2024-05-22 DIAGNOSIS — S83512D Sprain of anterior cruciate ligament of left knee, subsequent encounter: Secondary | ICD-10-CM

## 2024-05-23 ENCOUNTER — Encounter: Payer: Self-pay | Admitting: Orthopedic Surgery

## 2024-05-23 NOTE — Progress Notes (Signed)
   Post-Op Visit Note   Patient: Stacey Davidson           Date of Birth: June 27, 1976           MRN: [id number] Visit Date: 05/22/2024 PCP: [name], PA   Assessment & Plan:  Chief Complaint:  Chief Complaint  Patient presents with   Left Knee - Follow-up    Left knee scope 05/13/2024   Visit Diagnoses:  1. Rupture of anterior cruciate ligament of left knee, subsequent encounter     Plan: Stacey Davidson is a 48 year old patient with left knee multiligament reconstruction performed 05/13/2024.  On exam she has CPM to 80.  Taking aspirin daily for DVT prophylaxis.  No calf tenderness negative Homans on exam.  She does have excellent graft stability to anterior drawer testing which is about 1 to 1-1/2 mm with solid endpoint.  There is no posterolateral rotatory instability either.  Trace knee effusion.  Plan at this time is to discontinue the sutures and Steri-Stripped the portal incisions.  Physical therapy to start here next week for nonweightbearing 4 weeks total after the procedure.  Okay for range of motion and quad strengthening as tolerated.  Will start out 1 time a week for 3 weeks and then go 3 times a week for 4 weeks once she is able to start doing some strengthening.  Follow-Up Instructions: No follow-ups on file.   Orders:  Orders Placed This Encounter  Procedures   Ambulatory referral to Physical Therapy   No orders of the defined types were placed in this encounter.   Imaging: No results found.  PMFS History: Patient Active Problem List   Diagnosis Date Noted   Carotid artery disease 06/20/2023   Neutropenia 01/07/2022   Past Medical History:  Diagnosis Date   Hyperlipidemia    Neutropenia     Family History  Problem Relation Age of Onset   Hypertension Mother    Hyperlipidemia Mother    Hypothyroidism Mother    Colon cancer Maternal Aunt    Congestive Heart Failure Maternal Aunt    Congestive Heart Failure Maternal Uncle    Prostate cancer Maternal Uncle     Prostate cancer Maternal Uncle    Ovarian cancer Cousin    Hypertension Brother    Breast cancer Neg Hx     Past Surgical History:  Procedure Laterality Date   GYNECOLOGIC CRYOSURGERY  2006   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

## 2024-05-23 NOTE — Telephone Encounter (Signed)
 Hi Stacey Davidson can you have her come in at some point within the next week and just take out the 2 simple nylon sutures.  In the hip.  Thanks

## 2024-05-23 NOTE — Telephone Encounter (Signed)
 Scheduled for 10/21 at 11:00 am for Nurse Visit

## 2024-05-28 ENCOUNTER — Ambulatory Visit

## 2024-05-28 ENCOUNTER — Ambulatory Visit: Admitting: Physical Therapy

## 2024-05-28 ENCOUNTER — Encounter: Payer: Self-pay | Admitting: Physical Therapy

## 2024-05-28 DIAGNOSIS — R600 Localized edema: Secondary | ICD-10-CM

## 2024-05-28 DIAGNOSIS — M6281 Muscle weakness (generalized): Secondary | ICD-10-CM | POA: Diagnosis not present

## 2024-05-28 DIAGNOSIS — M25662 Stiffness of left knee, not elsewhere classified: Secondary | ICD-10-CM | POA: Diagnosis not present

## 2024-05-28 DIAGNOSIS — R2681 Unsteadiness on feet: Secondary | ICD-10-CM

## 2024-05-28 DIAGNOSIS — M25562 Pain in left knee: Secondary | ICD-10-CM

## 2024-05-28 DIAGNOSIS — R2689 Other abnormalities of gait and mobility: Secondary | ICD-10-CM
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
## 2024-06-03 ENCOUNTER — Other Ambulatory Visit: Payer: Self-pay

## 2024-06-03 DIAGNOSIS — I6529 Occlusion and stenosis of unspecified carotid artery: Secondary | ICD-10-CM

## 2024-06-04 ENCOUNTER — Encounter: Admitting: Physical Therapy

## 2024-06-05 ENCOUNTER — Other Ambulatory Visit: Payer: Self-pay | Admitting: Surgical

## 2024-06-06 ENCOUNTER — Telehealth: Payer: Self-pay | Admitting: Orthopedic Surgery

## 2024-06-06 NOTE — Telephone Encounter (Signed)
 Duplicate message. My Chart message sent to [facility] to advise.

## 2024-06-06 NOTE — Telephone Encounter (Signed)
 Patient called and said that the [employer] machine company is on the way to pick it up from her house because they rent it for 21 days. She not sure if she has to have it. [phone]

## 2024-06-07 ENCOUNTER — Ambulatory Visit (INDEPENDENT_AMBULATORY_CARE_PROVIDER_SITE_OTHER): Admitting: Surgical

## 2024-06-07 ENCOUNTER — Encounter: Payer: Self-pay | Admitting: Rehabilitative and Restorative Service Providers"

## 2024-06-07 ENCOUNTER — Ambulatory Visit (INDEPENDENT_AMBULATORY_CARE_PROVIDER_SITE_OTHER): Admitting: Rehabilitative and Restorative Service Providers"

## 2024-06-07 ENCOUNTER — Encounter: Payer: Self-pay | Admitting: Surgical

## 2024-06-07 DIAGNOSIS — M25662 Stiffness of left knee, not elsewhere classified: Secondary | ICD-10-CM | POA: Diagnosis not present

## 2024-06-07 DIAGNOSIS — R600 Localized edema: Secondary | ICD-10-CM

## 2024-06-07 DIAGNOSIS — M25562 Pain in left knee: Secondary | ICD-10-CM | POA: Diagnosis not present

## 2024-06-07 DIAGNOSIS — R2689 Other abnormalities of gait and mobility: Secondary | ICD-10-CM

## 2024-06-07 DIAGNOSIS — M6281 Muscle weakness (generalized): Secondary | ICD-10-CM

## 2024-06-07 DIAGNOSIS — S83512D Sprain of anterior cruciate ligament of left knee, subsequent encounter: Secondary | ICD-10-CM

## 2024-06-07 DIAGNOSIS — R2681 Unsteadiness on feet: Secondary | ICD-10-CM

## 2024-06-07 NOTE — Progress Notes (Signed)
 Post-Op Visit Note   Patient: Stacey Davidson           Date of Birth: 1975-11-18           MRN: [id number] Visit Date: 06/07/2024 PCP: [name], PA   Assessment & Plan:  Chief Complaint:  Chief Complaint  Patient presents with   Left Knee - Routine Post Op, Follow-up    05/13/2024 Multiligament reconstruction of left knee   Visit Diagnoses:  1. Rupture of anterior cruciate ligament of left knee, subsequent encounter     Plan: Patient is a 48 year old female who presents s/p left knee multiligament reconstruction on 05/13/2024.  She had ACL reconstruction with bone patellar tendon bone allograft with B MAC augmentation as well as posterolateral corner reconstruction.  Overall she is doing better and better.  She still nonweightbearing with crutches and a Bledsoe brace.  Really has no pain or discomfort except for when doing physical therapy exercises.  Taking aspirin twice a day.  Denies any calf pain or chest pain.  On exam, patient has intact quad strength rated 5/5.  Able to perform straight leg raise without extensor lag multiple times.  She has 0 degrees extension and 100 degrees of knee flexion quite easily.  No calf tenderness.  Negative Homans' sign.  Palpable DP pulse.  Intact ankle dorsiflexion and plantarflexion.  Stable to anterior drawer and Lachman exam.  Stable to stressing posterior lateral corner.  Incisions are healing well without evidence of infection or dehiscence.  Small effusion present.  Plan at this time is continue with nonweightbearing until next Wednesday and at that time she can transition back to weightbearing with 2 crutches and she will work on weaning from 2 crutches to 1 crutch to ambulation without assistance.  Did recommend she continue using the Bledsoe brace which was unlocked for her today for full range of motion and she will use this until her next appointment in 4 weeks.  Follow-up in 4 weeks for clinical recheck.  Follow-Up Instructions: No  follow-ups on file.   Orders:  No orders of the defined types were placed in this encounter.  No orders of the defined types were placed in this encounter.   Imaging: No results found.  PMFS History: Patient Active Problem List   Diagnosis Date Noted   Carotid artery disease 06/20/2023   Neutropenia 01/07/2022   Past Medical History:  Diagnosis Date   Hyperlipidemia    Neutropenia     Family History  Problem Relation Age of Onset   Hypertension Mother    Hyperlipidemia Mother    Hypothyroidism Mother    Colon cancer Maternal Aunt    Congestive Heart Failure Maternal Aunt    Congestive Heart Failure Maternal Uncle    Prostate cancer Maternal Uncle    Prostate cancer Maternal Uncle    Ovarian cancer Cousin    Hypertension Brother    Breast cancer Neg Hx     Past Surgical History:  Procedure Laterality Date   GYNECOLOGIC CRYOSURGERY  2006   

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
## 2024-06-10 ENCOUNTER — Encounter: Payer: Self-pay | Admitting: Radiology

## 2024-06-10 ENCOUNTER — Encounter: Admitting: Surgical

## 2024-06-10 ENCOUNTER — Encounter: Admitting: Physical Therapy

## 2024-06-12 ENCOUNTER — Encounter: Admitting: Surgical

## 2024-06-12 ENCOUNTER — Encounter: Payer: Self-pay | Admitting: Physical Therapy

## 2024-06-12 ENCOUNTER — Ambulatory Visit (INDEPENDENT_AMBULATORY_CARE_PROVIDER_SITE_OTHER): Admitting: Physical Therapy

## 2024-06-12 DIAGNOSIS — M6281 Muscle weakness (generalized): Secondary | ICD-10-CM | POA: Diagnosis not present

## 2024-06-12 DIAGNOSIS — R2681 Unsteadiness on feet: Secondary | ICD-10-CM

## 2024-06-12 DIAGNOSIS — R600 Localized edema: Secondary | ICD-10-CM

## 2024-06-12 DIAGNOSIS — M25662 Stiffness of left knee, not elsewhere classified: Secondary | ICD-10-CM

## 2024-06-12 DIAGNOSIS — R2689 Other abnormalities of gait and mobility: Secondary | ICD-10-CM

## 2024-06-12 DIAGNOSIS — M25562 Pain in left knee: Secondary | ICD-10-CM | POA: Diagnosis not present
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
## 2024-06-17 ENCOUNTER — Ambulatory Visit: Admitting: Physical Therapy

## 2024-06-17 ENCOUNTER — Encounter: Payer: Self-pay | Admitting: Physical Therapy

## 2024-06-17 DIAGNOSIS — R600 Localized edema: Secondary | ICD-10-CM | POA: Diagnosis not present

## 2024-06-17 DIAGNOSIS — R2681 Unsteadiness on feet: Secondary | ICD-10-CM

## 2024-06-17 DIAGNOSIS — M25562 Pain in left knee: Secondary | ICD-10-CM | POA: Diagnosis not present

## 2024-06-17 DIAGNOSIS — R2689 Other abnormalities of gait and mobility: Secondary | ICD-10-CM

## 2024-06-17 DIAGNOSIS — M6281 Muscle weakness (generalized): Secondary | ICD-10-CM | POA: Diagnosis not present

## 2024-06-17 DIAGNOSIS — M25662 Stiffness of left knee, not elsewhere classified: Secondary | ICD-10-CM | POA: Diagnosis not present
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
## 2024-06-19 ENCOUNTER — Ambulatory Visit: Admitting: Physical Therapy

## 2024-06-19 ENCOUNTER — Encounter: Payer: Self-pay | Admitting: Physical Therapy

## 2024-06-19 DIAGNOSIS — M6281 Muscle weakness (generalized): Secondary | ICD-10-CM | POA: Diagnosis not present

## 2024-06-19 DIAGNOSIS — M25562 Pain in left knee: Secondary | ICD-10-CM

## 2024-06-19 DIAGNOSIS — R2681 Unsteadiness on feet: Secondary | ICD-10-CM

## 2024-06-19 DIAGNOSIS — R2689 Other abnormalities of gait and mobility: Secondary | ICD-10-CM

## 2024-06-19 DIAGNOSIS — M25662 Stiffness of left knee, not elsewhere classified: Secondary | ICD-10-CM

## 2024-06-19 DIAGNOSIS — R600 Localized edema: Secondary | ICD-10-CM | POA: Diagnosis not present
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
## 2024-06-21 ENCOUNTER — Encounter: Payer: Self-pay | Admitting: Rehabilitative and Restorative Service Providers"

## 2024-06-21 ENCOUNTER — Ambulatory Visit (INDEPENDENT_AMBULATORY_CARE_PROVIDER_SITE_OTHER): Admitting: Rehabilitative and Restorative Service Providers"

## 2024-06-21 DIAGNOSIS — M25562 Pain in left knee: Secondary | ICD-10-CM | POA: Diagnosis not present

## 2024-06-21 DIAGNOSIS — M25662 Stiffness of left knee, not elsewhere classified: Secondary | ICD-10-CM

## 2024-06-21 DIAGNOSIS — R600 Localized edema: Secondary | ICD-10-CM

## 2024-06-21 DIAGNOSIS — R2681 Unsteadiness on feet: Secondary | ICD-10-CM

## 2024-06-21 DIAGNOSIS — M6281 Muscle weakness (generalized): Secondary | ICD-10-CM

## 2024-06-21 DIAGNOSIS — R2689 Other abnormalities of gait and mobility: Secondary | ICD-10-CM
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
## 2024-06-24 ENCOUNTER — Ambulatory Visit (INDEPENDENT_AMBULATORY_CARE_PROVIDER_SITE_OTHER)

## 2024-06-24 DIAGNOSIS — R600 Localized edema: Secondary | ICD-10-CM

## 2024-06-24 DIAGNOSIS — M25562 Pain in left knee: Secondary | ICD-10-CM

## 2024-06-24 DIAGNOSIS — R2681 Unsteadiness on feet: Secondary | ICD-10-CM

## 2024-06-24 DIAGNOSIS — M25662 Stiffness of left knee, not elsewhere classified: Secondary | ICD-10-CM | POA: Diagnosis not present

## 2024-06-24 DIAGNOSIS — M6281 Muscle weakness (generalized): Secondary | ICD-10-CM

## 2024-06-24 DIAGNOSIS — R2689 Other abnormalities of gait and mobility: Secondary | ICD-10-CM

## 2024-06-26 ENCOUNTER — Ambulatory Visit (INDEPENDENT_AMBULATORY_CARE_PROVIDER_SITE_OTHER): Admitting: Rehabilitative and Restorative Service Providers"

## 2024-06-26 ENCOUNTER — Encounter

## 2024-06-26 ENCOUNTER — Encounter: Payer: Self-pay | Admitting: Rehabilitative and Restorative Service Providers"

## 2024-06-26 DIAGNOSIS — R600 Localized edema: Secondary | ICD-10-CM

## 2024-06-26 DIAGNOSIS — M6281 Muscle weakness (generalized): Secondary | ICD-10-CM

## 2024-06-26 DIAGNOSIS — M25562 Pain in left knee: Secondary | ICD-10-CM | POA: Diagnosis not present

## 2024-06-26 DIAGNOSIS — R2689 Other abnormalities of gait and mobility: Secondary | ICD-10-CM

## 2024-06-26 DIAGNOSIS — M25662 Stiffness of left knee, not elsewhere classified: Secondary | ICD-10-CM | POA: Diagnosis not present

## 2024-06-26 DIAGNOSIS — R2681 Unsteadiness on feet: Secondary | ICD-10-CM
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
## 2024-06-28 ENCOUNTER — Ambulatory Visit (INDEPENDENT_AMBULATORY_CARE_PROVIDER_SITE_OTHER)

## 2024-06-28 DIAGNOSIS — R600 Localized edema: Secondary | ICD-10-CM

## 2024-06-28 DIAGNOSIS — M6281 Muscle weakness (generalized): Secondary | ICD-10-CM

## 2024-06-28 DIAGNOSIS — M25562 Pain in left knee: Secondary | ICD-10-CM

## 2024-06-28 DIAGNOSIS — R2681 Unsteadiness on feet: Secondary | ICD-10-CM

## 2024-06-28 DIAGNOSIS — M25662 Stiffness of left knee, not elsewhere classified: Secondary | ICD-10-CM

## 2024-06-28 DIAGNOSIS — R2689 Other abnormalities of gait and mobility: Secondary | ICD-10-CM

## 2024-07-01 ENCOUNTER — Ambulatory Visit (INDEPENDENT_AMBULATORY_CARE_PROVIDER_SITE_OTHER): Admitting: Physical Therapy

## 2024-07-01 ENCOUNTER — Encounter: Payer: Self-pay | Admitting: Physical Therapy

## 2024-07-01 DIAGNOSIS — M25562 Pain in left knee: Secondary | ICD-10-CM | POA: Diagnosis not present

## 2024-07-01 DIAGNOSIS — R600 Localized edema: Secondary | ICD-10-CM

## 2024-07-01 DIAGNOSIS — M25662 Stiffness of left knee, not elsewhere classified: Secondary | ICD-10-CM

## 2024-07-01 DIAGNOSIS — M6281 Muscle weakness (generalized): Secondary | ICD-10-CM

## 2024-07-01 DIAGNOSIS — R2689 Other abnormalities of gait and mobility: Secondary | ICD-10-CM

## 2024-07-01 DIAGNOSIS — R2681 Unsteadiness on feet: Secondary | ICD-10-CM
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
## 2024-07-02 ENCOUNTER — Ambulatory Visit (HOSPITAL_COMMUNITY)
Admission: RE | Admit: 2024-07-02 | Discharge: 2024-07-02 | Disposition: A | Discharge status: Home / Self Care | Source: Ambulatory Visit | Attending: Vascular Surgery | Admitting: Vascular Surgery

## 2024-07-02 ENCOUNTER — Encounter: Payer: Self-pay | Admitting: Vascular Surgery

## 2024-07-02 ENCOUNTER — Ambulatory Visit (INDEPENDENT_AMBULATORY_CARE_PROVIDER_SITE_OTHER): Admitting: Vascular Surgery

## 2024-07-02 VITALS — BP 107/72 | HR 60 | Temp 97.9°F | Resp 18 | Ht 66.25 in | Wt 148.0 lb

## 2024-07-02 DIAGNOSIS — I6529 Occlusion and stenosis of unspecified carotid artery: Secondary | ICD-10-CM | POA: Insufficient documentation

## 2024-07-02 DIAGNOSIS — I6522 Occlusion and stenosis of left carotid artery: Secondary | ICD-10-CM | POA: Insufficient documentation

## 2024-07-02 NOTE — Progress Notes (Signed)
 Patient name: Stacey Davidson MRN: [id number] DOB: 10/02/1975 Sex: female  REASON FOR CONSULT: 1 year f/u, surveillance carotid artery disease  HPI: Stacey Davidson is a 48 y.o. female, with hx HLD that presents for surveillance of her carotid artery disease.  She states this was discovered several years ago when her PCP heard a bruit on the left side of her neck.  She has been followed at [facility] by Dr. Andrew.  States it is now easier for her to come to [place].  On last visit she had a 40 to 59% left ICA stenosis from 06/20/2023.  She denies any history of strokes or TIAs.  She is on aspirin  and statin.    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  States previously on a higher dose statin and had to drop the dose to 20 mg.  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   husband now deceased    Family History  Problem Relation Age of Onset   Hypertension Mother    Hyperlipidemia Mother    Hypothyroidism Mother    Colon cancer Maternal Aunt    Congestive Heart Failure Maternal Aunt    Congestive Heart Failure Maternal Uncle    Prostate cancer Maternal Uncle    Prostate cancer Maternal Uncle    Ovarian cancer Cousin    Hypertension Brother    Breast cancer Neg Hx     SOCIAL HISTORY: Social History   Socioeconomic History   Marital status: Widowed    Spouse name: Not on file   Number of children: Not on file   Years of education: Not on file   Highest education level: Master's degree (e.g., MA, MS, MEng, MEd, MSW, MBA)  Occupational History   Not on file  Tobacco Use   Smoking status: Never   Smokeless tobacco: Never  Vaping Use   Vaping status: Never Used  Substance and Sexual Activity   Alcohol use: Yes    Alcohol/week: 1.0 standard drink of alcohol    Types: 1 Glasses of wine per week    Comment:  Occ   Drug use: No   Sexual activity: Not Currently    Birth control/protection: None  Other Topics Concern   Not on file  Social History Narrative   Not on file   Social Drivers of Health   Financial Resource Strain: Low Risk  (11/27/2023)   Overall Financial Resource Strain (CARDIA)    Difficulty of Paying Living Expenses: Not hard at all  Food Insecurity: No Food Insecurity (11/27/2023)   Hunger Vital Sign    Worried About Running Out of Food in the Last Year: Never true    Ran Out of Food in the Last Year: Never true  Transportation Needs: No Transportation Needs (11/27/2023)   PRAPARE - Administrator, Civil Service (Medical): No    Lack of Transportation (Non-Medical): No  Physical Activity: Insufficiently Active (11/27/2023)   Exercise Vital Sign    Days of Exercise per Week: 4 days    Minutes of Exercise per Session: 30 min  Stress: No Stress Concern Present (11/27/2023)   [employer] of Occupational Health - Occupational Stress Questionnaire    Feeling of Stress : Not at all  Social Connections: Moderately Isolated (11/27/2023)   Social Connection and Isolation  Panel    Frequency of Communication with Friends and Family: More than three times a week    Frequency of Social Gatherings with Friends and Family: Twice a week    Attends Religious Services: More than 4 times per year    Active Member of [employer] or Organizations: No    Attends Banker Meetings: Not on file    Marital Status: Widowed  Intimate Partner Violence: Not At Risk (12/28/2021)   Received from [facility] visits prior to 10/08/2022.   Humiliation, Afraid, Rape, and Kick questionnaire    Within the last year, have you been afraid of your partner or ex-partner?: No    Within the last year, have you been humiliated or emotionally abused in other ways by your partner or ex-partner?: No    Within the last year, have you been kicked, hit, slapped, or otherwise  physically hurt by your partner or ex-partner?: No    Within the last year, have you been raped or forced to have any kind of sexual activity by your partner or ex-partner?: No    No Known Allergies  Current Outpatient Medications  Medication Sig Dispense Refill   aspirin  81 MG chewable tablet Chew 1 tablet (81 mg total) by mouth daily. To prevent blood clots. Okay to return to regular aspirin  or discontinue after finishing this prescription 30 tablet 0   aspirin  EC 81 MG tablet Take by mouth.     atorvastatin  (LIPITOR) 20 MG tablet Take 1 tablet (20 mg total) by mouth daily. 90 tablet 3   celecoxib  (CELEBREX ) 100 MG capsule Take 1 capsule (100 mg total) by mouth 2 (two) times daily. 60 capsule 0   cetirizine (ZYRTEC ALLERGY) 10 MG tablet Take 10 mg by mouth daily.     levonorgestrel  (KYLEENA ) 19.5 MG IUD One     meclizine (ANTIVERT) 12.5 MG tablet Take 12.5 mg by mouth 2 (two) times daily as needed for dizziness.     Multiple Vitamin (MULTIVITAMIN WITH MINERALS) TABS tablet Take 1 tablet by mouth daily.     No current facility-administered medications for this visit.    REVIEW OF SYSTEMS:  [X]  denotes positive finding, [ ]  denotes negative finding Cardiac  Comments:  Chest pain or chest pressure:    Shortness of breath upon exertion:    Short of breath when lying flat:    Irregular heart rhythm:        Vascular    Pain in calf, thigh, or hip brought on by ambulation:    Pain in feet at night that wakes you up from your sleep:     Blood clot in your veins:    Leg swelling:         Pulmonary    Oxygen at home:    Productive cough:     Wheezing:         Neurologic    Sudden weakness in arms or legs:     Sudden numbness in arms or legs:     Sudden onset of difficulty speaking or slurred speech:    Temporary loss of vision in one eye:     Problems with dizziness:         Gastrointestinal    Blood in stool:     Vomited blood:         Genitourinary    Burning when  urinating:     Blood in urine:        Psychiatric  Major depression:         Hematologic    Bleeding problems:    Problems with blood clotting too easily:        Skin    Rashes or ulcers:        Constitutional    Fever or chills:      PHYSICAL EXAM: There were no vitals filed for this visit.   GENERAL: The patient is a well-nourished female, in no acute distress. The vital signs are documented above. CARDIAC: There is a regular rate and rhythm.  VASCULAR:  No neck incisions PULMONARY: No respiratory distress. ABDOMEN: Soft and non-tender. MUSCULOSKELETAL: There are no major deformities or cyanosis. NEUROLOGIC: No focal weakness or paresthesias are detected.  Cranial nerves II through XII grossly intact. SKIN: There are no ulcers or rashes noted. PSYCHIATRIC: The patient has a normal affect.  DATA:   Carotid duplex today shows 40 to 59% stenosis in bilateral ICAs - noted beaded appearance of bilateral ICA   Assessment/Plan:  48 y.o. female, with hx HLD that presents for surveillance of her carotid artery disease.  She states this was discovered several years ago when her PCP heard a bruit on the left side of her neck.  She has been followed at [facility] by Dr. Andrew.  States it is now easier for her to come to [place].  We have been following a 40 to 59% left ICA stenosis.  Discussed on duplex today her velocities are consistent with a 40 to 59% stenosis in bilateral ICAs.  The tech did make a comment about possible beading consistent with FMD although she does not carry this diagnosis.  Discussed the recommendations are the same and that we would recommend ongoing surveillance and not surgical intervention until she has greater than 80% stenosis for asymptomatic carotid disease.  I did briefly discussed the difference between traditional atherosclerotic plaque and nonatherosclerotic FMD disease.  She is on aspirin  and low-dose statin.  I will refer to the clinical  Pharm.D. here for clinical assistance managing her lipids and states she is in between PCPs.  I will see her in 1 year with repeat carotid duplex   Lonni DOROTHA Gaskins, MD [facility]: [phone]

## 2024-07-03 ENCOUNTER — Ambulatory Visit (INDEPENDENT_AMBULATORY_CARE_PROVIDER_SITE_OTHER)

## 2024-07-03 DIAGNOSIS — R600 Localized edema: Secondary | ICD-10-CM | POA: Diagnosis not present

## 2024-07-03 DIAGNOSIS — M25562 Pain in left knee: Secondary | ICD-10-CM | POA: Diagnosis not present

## 2024-07-03 DIAGNOSIS — R2681 Unsteadiness on feet: Secondary | ICD-10-CM

## 2024-07-03 DIAGNOSIS — M25662 Stiffness of left knee, not elsewhere classified: Secondary | ICD-10-CM

## 2024-07-03 DIAGNOSIS — M6281 Muscle weakness (generalized): Secondary | ICD-10-CM

## 2024-07-03 DIAGNOSIS — R2689 Other abnormalities of gait and mobility: Secondary | ICD-10-CM
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
## 2024-07-08 ENCOUNTER — Encounter: Payer: Self-pay | Admitting: Surgical

## 2024-07-08 ENCOUNTER — Encounter: Payer: Self-pay | Admitting: Rehabilitative and Restorative Service Providers"

## 2024-07-08 ENCOUNTER — Ambulatory Visit: Admitting: Surgical

## 2024-07-08 ENCOUNTER — Ambulatory Visit: Admitting: Rehabilitative and Restorative Service Providers"

## 2024-07-08 DIAGNOSIS — R600 Localized edema: Secondary | ICD-10-CM

## 2024-07-08 DIAGNOSIS — M25662 Stiffness of left knee, not elsewhere classified: Secondary | ICD-10-CM | POA: Diagnosis not present

## 2024-07-08 DIAGNOSIS — M25562 Pain in left knee: Secondary | ICD-10-CM | POA: Diagnosis not present

## 2024-07-08 DIAGNOSIS — M6281 Muscle weakness (generalized): Secondary | ICD-10-CM

## 2024-07-08 DIAGNOSIS — R2681 Unsteadiness on feet: Secondary | ICD-10-CM

## 2024-07-08 DIAGNOSIS — R2689 Other abnormalities of gait and mobility: Secondary | ICD-10-CM

## 2024-07-08 DIAGNOSIS — Z9889 Other specified postprocedural states: Secondary | ICD-10-CM

## 2024-07-08 NOTE — Progress Notes (Signed)
 Post-Op Visit Note   Patient: Stacey Davidson           Date of Birth: 1976/07/02           MRN: [id number] Visit Date: 07/08/2024 PCP: [name], PA   Assessment & Plan:  Chief Complaint: No chief complaint on file.  Visit Diagnoses:  1. S/P ACL reconstruction     Plan: Patient is a 48 year old female who presents s/p left knee multiligament reconstruction with ACL reconstruction and posterolateral corner reconstruction on 05/13/2024.  Doing well.  Ambulating without assistance aside from using Bledsoe brace.  Pain is doing well and really does not have any pain aside from some aching sensation in around the anterior aspect of the knee.  Does notice some swelling when standing for long periods of time.  Still taking aspirin  once daily.  Having occasional tingling through the posterior calf and occasionally this will extend down to the foot (hard to say if it is the top or the bottom of the foot according to her (.  She is doing physical therapy primarily working on strengthening and balance.  On exam, patient has incisions that are well-healed.  Small effusion present.  ACL graft is stable on Lachman exam and by anterior drawer.  Stable to posterolateral rotational stress.  She does have a weakly positive Tinel sign with palpation over the peroneal nerve but there is no dorsiflexion weakness compared with the contralateral side.  2+ DP pulse of the operative extremity.  No calf tenderness.  Negative Homans' sign.  Excellent dorsiflexion and plantarflexion strength bilaterally.  She has excellent quad and hamstring strength.  0 degrees extension and 115 degrees of knee flexion.  Plan at this time is to continue with physical therapy.  Follow-up in 6 weeks.  We will keep an eye on the tingling that she is experiencing at the next appointment but expect this to resolve as we get further out from surgery; may be related to traction on the peroneal nerve during surgery.  Not causing any  weakness.  Follow-Up Instructions: No follow-ups on file.   Orders:  No orders of the defined types were placed in this encounter.  No orders of the defined types were placed in this encounter.   Imaging: No results found.  PMFS History: Patient Active Problem List   Diagnosis Date Noted   Carotid artery disease 06/20/2023   Neutropenia 01/07/2022   Past Medical History:  Diagnosis Date   Carotid artery occlusion    Hyperlipidemia    Neutropenia     Family History  Problem Relation Age of Onset   Hypertension Mother    Hyperlipidemia Mother    Hypothyroidism Mother    Colon cancer Maternal Aunt    Congestive Heart Failure Maternal Aunt    Congestive Heart Failure Maternal Uncle    Prostate cancer Maternal Uncle    Prostate cancer Maternal Uncle    Ovarian cancer Cousin    Hypertension Brother    Breast cancer Neg Hx     Past Surgical History:  Procedure Laterality Date   GYNECOLOGIC CRYOSURGERY  2006   

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
## 2024-07-10 ENCOUNTER — Encounter: Payer: Self-pay | Admitting: Physical Therapy

## 2024-07-10 ENCOUNTER — Ambulatory Visit: Admitting: Physical Therapy

## 2024-07-10 DIAGNOSIS — M6281 Muscle weakness (generalized): Secondary | ICD-10-CM | POA: Diagnosis not present

## 2024-07-10 DIAGNOSIS — M25662 Stiffness of left knee, not elsewhere classified: Secondary | ICD-10-CM

## 2024-07-10 DIAGNOSIS — R600 Localized edema: Secondary | ICD-10-CM

## 2024-07-10 DIAGNOSIS — M25562 Pain in left knee: Secondary | ICD-10-CM | POA: Diagnosis not present

## 2024-07-10 DIAGNOSIS — R2689 Other abnormalities of gait and mobility: Secondary | ICD-10-CM
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
## 2024-07-12 ENCOUNTER — Ambulatory Visit: Admitting: Physical Therapy

## 2024-07-12 ENCOUNTER — Encounter: Payer: Self-pay | Admitting: Physical Therapy

## 2024-07-12 DIAGNOSIS — M6281 Muscle weakness (generalized): Secondary | ICD-10-CM

## 2024-07-12 DIAGNOSIS — M25662 Stiffness of left knee, not elsewhere classified: Secondary | ICD-10-CM

## 2024-07-12 DIAGNOSIS — M25562 Pain in left knee: Secondary | ICD-10-CM | POA: Diagnosis not present

## 2024-07-12 DIAGNOSIS — R600 Localized edema: Secondary | ICD-10-CM | POA: Diagnosis not present
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
## 2024-07-15 ENCOUNTER — Encounter: Payer: Self-pay | Admitting: Rehabilitative and Restorative Service Providers"

## 2024-07-15 ENCOUNTER — Ambulatory Visit: Admitting: Rehabilitative and Restorative Service Providers"

## 2024-07-15 DIAGNOSIS — M25562 Pain in left knee: Secondary | ICD-10-CM

## 2024-07-15 DIAGNOSIS — R600 Localized edema: Secondary | ICD-10-CM

## 2024-07-15 DIAGNOSIS — R2689 Other abnormalities of gait and mobility: Secondary | ICD-10-CM

## 2024-07-15 DIAGNOSIS — M6281 Muscle weakness (generalized): Secondary | ICD-10-CM

## 2024-07-15 DIAGNOSIS — R2681 Unsteadiness on feet: Secondary | ICD-10-CM

## 2024-07-15 DIAGNOSIS — M25662 Stiffness of left knee, not elsewhere classified: Secondary | ICD-10-CM
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
## 2024-07-16 ENCOUNTER — Ambulatory Visit: Admitting: Pharmacist

## 2024-07-16 ENCOUNTER — Encounter: Payer: 59 | Admitting: Nurse Practitioner

## 2024-07-17 ENCOUNTER — Ambulatory Visit (INDEPENDENT_AMBULATORY_CARE_PROVIDER_SITE_OTHER): Admitting: Rehabilitative and Restorative Service Providers"

## 2024-07-17 ENCOUNTER — Encounter: Payer: Self-pay | Admitting: Rehabilitative and Restorative Service Providers"

## 2024-07-17 DIAGNOSIS — R600 Localized edema: Secondary | ICD-10-CM

## 2024-07-17 DIAGNOSIS — M25562 Pain in left knee: Secondary | ICD-10-CM

## 2024-07-17 DIAGNOSIS — M6281 Muscle weakness (generalized): Secondary | ICD-10-CM

## 2024-07-17 DIAGNOSIS — M25662 Stiffness of left knee, not elsewhere classified: Secondary | ICD-10-CM

## 2024-07-17 DIAGNOSIS — R2689 Other abnormalities of gait and mobility: Secondary | ICD-10-CM

## 2024-07-17 DIAGNOSIS — R2681 Unsteadiness on feet: Secondary | ICD-10-CM
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
## 2024-07-18 NOTE — Progress Notes (Unsigned)
 [facility] Pharmacist Note  Name: Stacey Davidson  MRN: [id number]  DOB: 1976/07/19  Sex: female PCP: [name], PA CPP Referral Provider: Dr. Gretta  HISTORY OF PRESENT ILLNESS: Stacey Davidson is a 48 y.o. female with PMH asymptomatic carotid artery stenosis who presents for medication management for cardiovascular risk reduction.   Dyslipidemia/ASCVD  Current lipid-lowering medications: atorvastatin  20 mg daily  Previously tried medications/intolerances: atorvastatin  80 mg daily caused significant fatigue and drowsiness    Current antiplatelets/antithrombotics: aspirin  81 mg daily  Rx affordability and access: No issues identified   Current dietary habits: she focuses on a plant based diet and avoids processed food, fried food, and fast food  Current physical activity: Typically she goes to the gym 4 days per week, but recently had knee surgery and is participating in PT. Hoping to get back to the gym once she completes recovery from the surgery.   Patient is not up to date on annual influenza vaccine.  Patient is not up to date on COVID vaccines.   Past Medical History:  Diagnosis Date   Carotid artery occlusion    Hyperlipidemia    Neutropenia    Past Surgical History:  Procedure Laterality Date   GYNECOLOGIC CRYOSURGERY  2006   KIDNEY DONATION  2008-08-23   husband now deceased   Family History  Problem Relation Age of Onset   Hypertension Mother    Hyperlipidemia Mother    Hypothyroidism Mother    Colon cancer Maternal Aunt    Congestive Heart Failure Maternal Aunt    Congestive Heart Failure Maternal Uncle    Prostate cancer Maternal Uncle    Prostate cancer Maternal Uncle    Ovarian cancer Cousin    Hypertension Brother    Breast cancer Neg Hx    LABS: Lab Results  Component Value Date   CHOL 150 07/17/2023   HDL 79 07/17/2023   LDLCALC 61 07/17/2023   TRIG 35 07/17/2023   CHOLHDL 1.9 07/17/2023    Lab Results  Component Value Date   CREATININE 0.93  07/17/2023   BUN 9 07/17/2023   NA 140 07/17/2023   K 4.0 07/17/2023   CL 106 07/17/2023   CO2 27 07/17/2023   CrCl cannot be calculated (Patient's most recent lab result is older than the maximum 21 days allowed.).      Component Value Date/Time   PROT 6.8 07/17/2023 0959   ALBUMIN 4.0 07/12/2022 1504   AST 15 07/17/2023 0959   AST 12 (L) 07/12/2022 1504   ALT 11 07/17/2023 0959   ALT 11 07/12/2022 1504   ALKPHOS 64 07/12/2022 1504   BILITOT 0.6 07/17/2023 0959   BILITOT 0.6 07/12/2022 1504    Lab Results  Component Value Date   HGBA1C 5.2 07/17/2023    ASSESSMENT & PLAN:  Dyslipidemia Last LDL in December 2024 was 61 mg/dL, below goal <29 mg/dL. Will check an updated lipid panel today since this is due. She is fasting this morning. She was previously unable to tolerate atorvastatin  80 mg due to fatigue and decreased the dose to 40 mg. She thinks she tolerated the 40 mg dose okay, but due to desire to minimize medications she further reduced her dose to 20 mg a couple years ago. Given her carotid artery stenosis and desire for aggressive risk reduction, will increase atorvastatin  to 40 mg daily.  Counseled patient on treatment, including efficacy, dosing, administration, possible adverse effects, and anticipated cost. Confirmed she has an IUD. Reviewed long-term complications of uncontrolled cholesterol.  Reviewed goals for cholesterol readings with patient.  Reviewed dietary and lifestyle modifications to improve cholesterol.  Repeat lipid panel in 4-12 weeks.   Antiplatelets/Antithrombotics Patient with no recent revascularization.  Continue aspirin  81 mg daily  Counseled patient on treatment, including efficacy, dosing, administration, possible adverse effects, and anticipated cost.   Recommend annual influenza and COVID vaccines and patient declined. She stated she would like a new PCP in the [facility]. She logged in to her MyChart and I showed her how to  schedule a new visit with a [facility] PCP near her which she said she would do.  Follow up: Will call her with lab results and determine next steps  Izetta Henry, PharmD [facility] [phone]
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
## 2024-07-19 ENCOUNTER — Ambulatory Visit

## 2024-07-19 ENCOUNTER — Ambulatory Visit: Attending: Vascular Surgery | Admitting: Pharmacist

## 2024-07-19 VITALS — BP 118/75 | HR 69 | Wt 151.8 lb

## 2024-07-19 DIAGNOSIS — R2681 Unsteadiness on feet: Secondary | ICD-10-CM | POA: Diagnosis not present

## 2024-07-19 DIAGNOSIS — R600 Localized edema: Secondary | ICD-10-CM

## 2024-07-19 DIAGNOSIS — M25562 Pain in left knee: Secondary | ICD-10-CM | POA: Diagnosis not present

## 2024-07-19 DIAGNOSIS — I6522 Occlusion and stenosis of left carotid artery: Secondary | ICD-10-CM

## 2024-07-19 DIAGNOSIS — M6281 Muscle weakness (generalized): Secondary | ICD-10-CM

## 2024-07-19 DIAGNOSIS — R2689 Other abnormalities of gait and mobility: Secondary | ICD-10-CM | POA: Diagnosis not present

## 2024-07-19 DIAGNOSIS — M25662 Stiffness of left knee, not elsewhere classified: Secondary | ICD-10-CM

## 2024-07-19 MED ORDER — ATORVASTATIN CALCIUM 40 MG PO TABS: 40.0000 mg | ORAL_TABLET | Freq: Every day | ORAL | 3 refills | Status: AC

## 2024-07-19 NOTE — Patient Instructions (Signed)
-   Increase atorvastatin  to 40 mg daily - I will call you once we have your lab results

## 2024-07-20 LAB — LIPID PANEL
Chol/HDL Ratio: 1.9 ratio (ref 0.0–4.4)
Cholesterol, Total: 140 mg/dL (ref 100–199)
HDL: 74 mg/dL (ref >39)
LDL Chol Calc (NIH): 58 mg/dL (ref 0–99)
Triglycerides: 31 mg/dL (ref 0–149)
VLDL Cholesterol Cal: 8 mg/dL (ref 5–40)

## 2024-07-22 ENCOUNTER — Ambulatory Visit: Payer: Self-pay | Admitting: Pharmacist

## 2024-07-22 ENCOUNTER — Encounter: Admitting: Rehabilitative and Restorative Service Providers"
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
## 2024-07-24 ENCOUNTER — Ambulatory Visit

## 2024-07-24 DIAGNOSIS — R2681 Unsteadiness on feet: Secondary | ICD-10-CM | POA: Diagnosis not present

## 2024-07-24 DIAGNOSIS — M25662 Stiffness of left knee, not elsewhere classified: Secondary | ICD-10-CM

## 2024-07-24 DIAGNOSIS — R2689 Other abnormalities of gait and mobility: Secondary | ICD-10-CM | POA: Diagnosis not present

## 2024-07-24 DIAGNOSIS — M6281 Muscle weakness (generalized): Secondary | ICD-10-CM

## 2024-07-24 DIAGNOSIS — R600 Localized edema: Secondary | ICD-10-CM | POA: Diagnosis not present

## 2024-07-24 DIAGNOSIS — M25562 Pain in left knee: Secondary | ICD-10-CM

## 2024-07-26 ENCOUNTER — Encounter: Payer: Self-pay | Admitting: Rehabilitative and Restorative Service Providers"

## 2024-07-26 ENCOUNTER — Ambulatory Visit: Admitting: Physical Therapy

## 2024-07-26 DIAGNOSIS — R2689 Other abnormalities of gait and mobility: Secondary | ICD-10-CM | POA: Diagnosis not present

## 2024-07-26 DIAGNOSIS — R600 Localized edema: Secondary | ICD-10-CM | POA: Diagnosis not present

## 2024-07-26 DIAGNOSIS — M25662 Stiffness of left knee, not elsewhere classified: Secondary | ICD-10-CM

## 2024-07-26 DIAGNOSIS — M25562 Pain in left knee: Secondary | ICD-10-CM

## 2024-07-26 DIAGNOSIS — M6281 Muscle weakness (generalized): Secondary | ICD-10-CM

## 2024-07-26 DIAGNOSIS — R2681 Unsteadiness on feet: Secondary | ICD-10-CM
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
## 2024-07-29 ENCOUNTER — Ambulatory Visit: Admitting: Physical Therapy

## 2024-07-29 ENCOUNTER — Encounter: Payer: Self-pay | Admitting: Physical Therapy

## 2024-07-29 DIAGNOSIS — M25562 Pain in left knee: Secondary | ICD-10-CM

## 2024-07-29 DIAGNOSIS — M6281 Muscle weakness (generalized): Secondary | ICD-10-CM | POA: Diagnosis not present

## 2024-07-29 DIAGNOSIS — M25662 Stiffness of left knee, not elsewhere classified: Secondary | ICD-10-CM

## 2024-07-29 DIAGNOSIS — R2681 Unsteadiness on feet: Secondary | ICD-10-CM

## 2024-07-29 DIAGNOSIS — R600 Localized edema: Secondary | ICD-10-CM

## 2024-07-29 DIAGNOSIS — R2689 Other abnormalities of gait and mobility: Secondary | ICD-10-CM
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
## 2024-07-31 ENCOUNTER — Ambulatory Visit

## 2024-07-31 DIAGNOSIS — R600 Localized edema: Secondary | ICD-10-CM

## 2024-07-31 DIAGNOSIS — R2689 Other abnormalities of gait and mobility: Secondary | ICD-10-CM | POA: Diagnosis not present

## 2024-07-31 DIAGNOSIS — M25662 Stiffness of left knee, not elsewhere classified: Secondary | ICD-10-CM | POA: Diagnosis not present

## 2024-07-31 DIAGNOSIS — R2681 Unsteadiness on feet: Secondary | ICD-10-CM

## 2024-07-31 DIAGNOSIS — M6281 Muscle weakness (generalized): Secondary | ICD-10-CM | POA: Diagnosis not present

## 2024-07-31 DIAGNOSIS — M25562 Pain in left knee: Secondary | ICD-10-CM

## 2024-08-02 ENCOUNTER — Encounter

## 2024-08-05 ENCOUNTER — Ambulatory Visit (INDEPENDENT_AMBULATORY_CARE_PROVIDER_SITE_OTHER): Admitting: Rehabilitative and Restorative Service Providers"

## 2024-08-05 DIAGNOSIS — R2681 Unsteadiness on feet: Secondary | ICD-10-CM

## 2024-08-05 DIAGNOSIS — R600 Localized edema: Secondary | ICD-10-CM | POA: Diagnosis not present

## 2024-08-05 DIAGNOSIS — M25562 Pain in left knee: Secondary | ICD-10-CM

## 2024-08-05 DIAGNOSIS — R2689 Other abnormalities of gait and mobility: Secondary | ICD-10-CM | POA: Diagnosis not present

## 2024-08-05 DIAGNOSIS — M6281 Muscle weakness (generalized): Secondary | ICD-10-CM | POA: Diagnosis not present

## 2024-08-05 DIAGNOSIS — M25662 Stiffness of left knee, not elsewhere classified: Secondary | ICD-10-CM | POA: Diagnosis not present
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
## 2024-08-06 ENCOUNTER — Other Ambulatory Visit: Payer: Self-pay | Admitting: Physician Assistant

## 2024-08-06 DIAGNOSIS — D709 Neutropenia, unspecified: Secondary | ICD-10-CM

## 2024-08-06 NOTE — Progress Notes (Unsigned)
 "   Patient Care Team: [name], PA as PCP - General (Physician Assistant)   CHIEF COMPLAINT: Follow-up leukopenia  CURRENT THERAPY: Observation  INTERVAL HISTORY Ms. Landau returns for follow-up, last seen by Lacie Burton NP on 08/07/2023.   Ms. 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   atorvastatin  (LIPITOR) 40 MG tablet Take 1 tablet (40 mg total) by mouth daily.   cetirizine (ZYRTEC ALLERGY) 10 MG tablet Take 10 mg by mouth daily.   Cholecalciferol 125 MCG (5000 UT) capsule Take 5,000 Units by mouth daily.   fluticasone (FLONASE ALLERGY RELIEF) 50 MCG/ACT nasal spray Place 1 spray into both nostrils daily.   levonorgestrel  (KYLEENA ) 19.5 MG IUD One   meclizine (ANTIVERT) 12.5 MG tablet Take 12.5 mg by mouth 2 (two) times daily as needed for dizziness.   Multiple Vitamin (MULTIVITAMIN WITH MINERALS) TABS tablet Take 1 tablet by mouth daily.   No facility-administered encounter medications on file as of 08/07/2024.     There were no vitals filed for this visit.  There is no height or weight on file to calculate BMI.   PHYSICAL EXAM GENERAL:alert, no distress and comfortable SKIN: no rash  EYES: sclera clear NECK: without mass LYMPH:  no palpable cervical or supraclavicular lymphadenopathy  LUNGS: clear with normal breathing effort HEART: regular rate & rhythm, no lower extremity edema ABDOMEN: abdomen soft, non-tender and normal bowel sounds NEURO: alert & oriented x 3 with fluent speech, no focal motor/sensory deficits   CBC    Component Value Date/Time   WBC 3.1 (L) 02/06/2024  0718   WBC 2.5 (L) 07/17/2023 0959   RBC 4.30 02/06/2024 0718   HGB 13.1 02/06/2024 0718   HCT 39.5 02/06/2024 0718   PLT 203 02/06/2024 0718   MCV 91.9 02/06/2024 0718   MCH 30.5 02/06/2024 0718   MCHC 33.2 02/06/2024 0718   RDW 11.9 02/06/2024 0718   LYMPHSABS 1.4 02/06/2024 0718   MONOABS 0.3 02/06/2024 0718   EOSABS 0.1 02/06/2024 0718   BASOSABS 0.0 02/06/2024 0718     CMP     Component Value Date/Time   NA 140 07/17/2023 0959   K 4.0 07/17/2023 0959   CL 106 07/17/2023 0959   CO2 27 07/17/2023 0959   GLUCOSE 81 07/17/2023 0959   BUN 9 07/17/2023 0959   CREATININE 0.93 07/17/2023 0959   CALCIUM  9.2 07/17/2023 0959   PROT 6.8 07/17/2023 0959   ALBUMIN 4.0 07/12/2022 1504   AST 15 07/17/2023 0959   AST 12 (L) 07/12/2022 1504   ALT 11 07/17/2023 0959   ALT 11 07/12/2022 1504   ALKPHOS 64 07/12/2022 1504   BILITOT 0.6 07/17/2023 0959   BILITOT 0.6 07/12/2022 1504   GFRNONAA >60 07/12/2022 1504   GFRAA >60 04/03/2020 2159     ASSESSMENT & PLAN:  Jessicalynn Deshong is a 48 y.o. female who presents to the clinic for leukopenia/neutropenia.    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  -Labs today show *** -We reviewed infection precautions -Knows to call sooner if needed - such as severe neutropenia or other new cytopenias   Follow up: --RTC in one year with labs and follow up   No orders of the defined types were placed in this encounter.     I have spent a total of {CHL ONC TIME VISIT - DTPQU:[id number]} minutes of face-to-face and non-face-to-face time, preparing to see the patient, obtaining and/or reviewing separately obtained history, performing a medically appropriate examination, counseling and educating the patient, ordering medications/tests/procedures, referring and  communicating with other health care professionals, documenting clinical information in the electronic health record, independently interpreting results and communicating results to the patient, and care coordination.   Johnston Police PA-C Dept of Hematology and Oncology [facility] Phone: [phone]  "

## 2024-08-07 ENCOUNTER — Inpatient Hospital Stay: Payer: 59 | Attending: Physician Assistant

## 2024-08-07 ENCOUNTER — Encounter

## 2024-08-07 ENCOUNTER — Inpatient Hospital Stay (HOSPITAL_BASED_OUTPATIENT_CLINIC_OR_DEPARTMENT_OTHER): Payer: 59 | Admitting: Physician Assistant

## 2024-08-07 VITALS — BP 133/70 | HR 70 | Temp 97.5°F | Resp 20

## 2024-08-07 DIAGNOSIS — D709 Neutropenia, unspecified: Secondary | ICD-10-CM | POA: Diagnosis not present

## 2024-08-07 LAB — CMP (CANCER CENTER ONLY)
ALT: 31 U/L (ref 0–44)
AST: 33 U/L (ref 15–41)
Albumin: 4.2 g/dL (ref 3.5–5.0)
Alkaline Phosphatase: 114 U/L (ref 38–126)
Anion gap: 8 (ref 5–15)
BUN: 10 mg/dL (ref 6–20)
CO2: 26 mmol/L (ref 22–32)
Calcium: 9.3 mg/dL (ref 8.9–10.3)
Chloride: 106 mmol/L (ref 98–111)
Creatinine: 0.91 mg/dL (ref 0.44–1.00)
GFR, Estimated: >60 mL/min
Glucose, Bld: 90 mg/dL (ref 70–99)
Potassium: 4.5 mmol/L (ref 3.5–5.1)
Sodium: 139 mmol/L (ref 135–145)
Total Bilirubin: 0.4 mg/dL (ref 0.0–1.2)
Total Protein: 7.5 g/dL (ref 6.5–8.1)

## 2024-08-07 LAB — CBC WITH DIFFERENTIAL (CANCER CENTER ONLY)
Abs Immature Granulocytes: 0 K/uL (ref 0.00–0.07)
Basophils Absolute: 0 K/uL (ref 0.0–0.1)
Basophils Relative: 1 %
Eosinophils Absolute: 0.2 K/uL (ref 0.0–0.5)
Eosinophils Relative: 5 %
HCT: 38.9 % (ref 36.0–46.0)
Hemoglobin: 12.9 g/dL (ref 12.0–15.0)
Immature Granulocytes: 0 %
Lymphocytes Relative: 42 %
Lymphs Abs: 1.5 K/uL (ref 0.7–4.0)
MCH: 30.1 pg (ref 26.0–34.0)
MCHC: 33.2 g/dL (ref 30.0–36.0)
MCV: 90.9 fL (ref 80.0–100.0)
Monocytes Absolute: 0.3 K/uL (ref 0.1–1.0)
Monocytes Relative: 9 %
Neutro Abs: 1.6 K/uL — ABNORMAL LOW (ref 1.7–7.7)
Neutrophils Relative %: 43 %
Platelet Count: 219 K/uL (ref 150–400)
RBC: 4.28 MIL/uL (ref 3.87–5.11)
RDW: 11.9 % (ref 11.5–15.5)
WBC Count: 3.6 K/uL — ABNORMAL LOW (ref 4.0–10.5)
nRBC: 0 % (ref 0.0–0.2)

## 2024-08-09 ENCOUNTER — Ambulatory Visit (INDEPENDENT_AMBULATORY_CARE_PROVIDER_SITE_OTHER): Admitting: Physical Therapy

## 2024-08-09 ENCOUNTER — Encounter: Payer: Self-pay | Admitting: Physical Therapy

## 2024-08-09 DIAGNOSIS — R600 Localized edema: Secondary | ICD-10-CM

## 2024-08-09 DIAGNOSIS — R2681 Unsteadiness on feet: Secondary | ICD-10-CM

## 2024-08-09 DIAGNOSIS — M25562 Pain in left knee: Secondary | ICD-10-CM

## 2024-08-09 DIAGNOSIS — M25662 Stiffness of left knee, not elsewhere classified: Secondary | ICD-10-CM

## 2024-08-09 DIAGNOSIS — R2689 Other abnormalities of gait and mobility: Secondary | ICD-10-CM

## 2024-08-09 DIAGNOSIS — M6281 Muscle weakness (generalized): Secondary | ICD-10-CM
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
## 2024-08-13 ENCOUNTER — Ambulatory Visit (INDEPENDENT_AMBULATORY_CARE_PROVIDER_SITE_OTHER)

## 2024-08-13 DIAGNOSIS — R2681 Unsteadiness on feet: Secondary | ICD-10-CM | POA: Diagnosis not present

## 2024-08-13 DIAGNOSIS — M25662 Stiffness of left knee, not elsewhere classified: Secondary | ICD-10-CM

## 2024-08-13 DIAGNOSIS — R600 Localized edema: Secondary | ICD-10-CM | POA: Diagnosis not present

## 2024-08-13 DIAGNOSIS — R2689 Other abnormalities of gait and mobility: Secondary | ICD-10-CM

## 2024-08-13 DIAGNOSIS — M25562 Pain in left knee: Secondary | ICD-10-CM | POA: Diagnosis not present

## 2024-08-13 DIAGNOSIS — M6281 Muscle weakness (generalized): Secondary | ICD-10-CM | POA: Diagnosis not present

## 2024-08-15 ENCOUNTER — Encounter: Payer: Self-pay | Admitting: Physical Therapy

## 2024-08-15 ENCOUNTER — Ambulatory Visit (INDEPENDENT_AMBULATORY_CARE_PROVIDER_SITE_OTHER): Admitting: Physical Therapy

## 2024-08-15 DIAGNOSIS — M25662 Stiffness of left knee, not elsewhere classified: Secondary | ICD-10-CM

## 2024-08-15 DIAGNOSIS — M6281 Muscle weakness (generalized): Secondary | ICD-10-CM

## 2024-08-15 DIAGNOSIS — R600 Localized edema: Secondary | ICD-10-CM | POA: Diagnosis not present

## 2024-08-15 DIAGNOSIS — R2689 Other abnormalities of gait and mobility: Secondary | ICD-10-CM | POA: Diagnosis not present

## 2024-08-15 DIAGNOSIS — M25562 Pain in left knee: Secondary | ICD-10-CM

## 2024-08-15 DIAGNOSIS — R2681 Unsteadiness on feet: Secondary | ICD-10-CM | POA: Diagnosis not present
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
## 2024-08-19 ENCOUNTER — Ambulatory Visit: Admitting: Orthopedic Surgery

## 2024-08-19 ENCOUNTER — Telehealth: Payer: Self-pay | Admitting: Pharmacist

## 2024-08-19 ENCOUNTER — Encounter: Payer: Self-pay | Admitting: Orthopedic Surgery

## 2024-08-19 DIAGNOSIS — Z9889 Other specified postprocedural states: Secondary | ICD-10-CM

## 2024-08-19 NOTE — Telephone Encounter (Signed)
 Called patient to follow up on tolerability of atorvastatin . She reports overall she is tolerating the 40 mg dose well. She notices she sleeps a little longer on this dose, but this is not bothersome to her. She is currently looking for a new PCP and I previously provided her with information for [facility]. She prefers to have us  monitor her cholesterol yearly and will reach out to schedule her next appointment once she knows her schedule.

## 2024-08-19 NOTE — Progress Notes (Unsigned)
 "  Post-Op Visit Note   Patient: Stacey Davidson           Date of Birth: 01/02/1976           MRN: [id number] Visit Date: 08/19/2024 PCP: [name], PA   Assessment & Plan:  Chief Complaint:  Chief Complaint  Patient presents with   Left Knee - Routine Post Op     left knee multiligament reconstruction with ACL reconstruction and posterolateral corner reconstruction on 05/13/2024   Visit Diagnoses: No diagnosis found.  Plan: Patient is now about 3-1/2 months out left knee arthroscopy with multiligament reconstruction including ACL reconstruction with posterolateral corner reconstruction.  Doing well with no issues.  Was going to therapy 2 times a week but she switched to 1 time a week this week.  She is able to do things on her own at the gym.  No pain.  She is doing squats and balance exercises.  Also doing stationary bike at the beginning of therapy.  Doing exercises from physical therapy at the gym.  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  Not taking any medication for the problem yet.  She is able to get up and down the stairs.  She likes to do hiking.  On exam she has full range of motion.  Quad strength improving.  Graft is stable to Lachman and anterior drawer on the left comparable to the right.  About half millimeter more laxity with solid endpoint on that left-hand side.  Rotationally she is also good with symmetric rotation externally left versus right as well as stable and symmetric stress to varus testing left versus right at 0 and 30 degrees.  Plan at this time is 63-month return for final check.  Would hold off on any cutting or pivoting activities until then.  I think she is okay to do some mild or easy flat ground hiking.  Follow-Up Instructions: Return in about 3 months (around 11/17/2024).   Orders:  No orders of the defined types were placed in this encounter.  No orders of the defined types were placed in this encounter.   Imaging: No results found.  PMFS  History: Patient Active Problem List   Diagnosis Date Noted   Carotid artery disease 06/20/2023   Neutropenia 01/07/2022   Past Medical History:  Diagnosis Date   Carotid artery occlusion    Hyperlipidemia    Neutropenia     Family History  Problem Relation Age of Onset   Hypertension Mother    Hyperlipidemia Mother    Hypothyroidism Mother    Colon cancer Maternal Aunt    Congestive Heart Failure Maternal Aunt    Congestive Heart Failure Maternal Uncle    Prostate cancer Maternal Uncle    Prostate cancer Maternal Uncle    Ovarian cancer Cousin    Hypertension Brother    Breast cancer Neg Hx     Past Surgical History:  Procedure Laterality Date   GYNECOLOGIC CRYOSURGERY  2006   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

## 2024-08-20 ENCOUNTER — Encounter: Admitting: Physical Therapy

## 2024-08-22 ENCOUNTER — Encounter: Payer: Self-pay | Admitting: Rehabilitative and Restorative Service Providers"

## 2024-08-22 ENCOUNTER — Ambulatory Visit: Admitting: Rehabilitative and Restorative Service Providers"

## 2024-08-22 DIAGNOSIS — M25562 Pain in left knee: Secondary | ICD-10-CM | POA: Diagnosis not present

## 2024-08-22 DIAGNOSIS — R600 Localized edema: Secondary | ICD-10-CM

## 2024-08-22 DIAGNOSIS — R2689 Other abnormalities of gait and mobility: Secondary | ICD-10-CM | POA: Diagnosis not present

## 2024-08-22 DIAGNOSIS — M6281 Muscle weakness (generalized): Secondary | ICD-10-CM

## 2024-08-22 DIAGNOSIS — R2681 Unsteadiness on feet: Secondary | ICD-10-CM | POA: Diagnosis not present

## 2024-08-22 DIAGNOSIS — M25662 Stiffness of left knee, not elsewhere classified: Secondary | ICD-10-CM | POA: Diagnosis not present
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
## 2024-08-27 ENCOUNTER — Encounter: Admitting: Physical Therapy

## 2024-08-29 ENCOUNTER — Encounter: Admitting: Rehabilitative and Restorative Service Providers"

## 2024-09-03 ENCOUNTER — Encounter: Admitting: Physical Therapy

## 2024-09-05 ENCOUNTER — Ambulatory Visit: Admitting: Rehabilitative and Restorative Service Providers"

## 2024-09-05 ENCOUNTER — Encounter: Payer: Self-pay | Admitting: Rehabilitative and Restorative Service Providers"

## 2024-09-05 DIAGNOSIS — R2689 Other abnormalities of gait and mobility: Secondary | ICD-10-CM

## 2024-09-05 DIAGNOSIS — M25662 Stiffness of left knee, not elsewhere classified: Secondary | ICD-10-CM

## 2024-09-05 DIAGNOSIS — M6281 Muscle weakness (generalized): Secondary | ICD-10-CM | POA: Diagnosis not present

## 2024-09-05 DIAGNOSIS — R600 Localized edema: Secondary | ICD-10-CM | POA: Diagnosis not present

## 2024-09-05 DIAGNOSIS — M25562 Pain in left knee: Secondary | ICD-10-CM

## 2024-09-05 DIAGNOSIS — R2681 Unsteadiness on feet: Secondary | ICD-10-CM | POA: Diagnosis not present
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
## 2024-09-12 ENCOUNTER — Encounter: Payer: Self-pay | Admitting: Orthopedic Surgery

## 2024-09-19 ENCOUNTER — Encounter: Admitting: Rehabilitative and Restorative Service Providers"

## 2024-10-03 ENCOUNTER — Encounter: Admitting: Rehabilitative and Restorative Service Providers"

## 2024-11-25 ENCOUNTER — Ambulatory Visit: Admitting: Orthopedic Surgery

## 2025-07-14 ENCOUNTER — Inpatient Hospital Stay

## 2025-07-16 ENCOUNTER — Inpatient Hospital Stay: Admitting: Physician Assistant
